# Patient Record
Sex: Female | Born: 1955 | Race: White | Hispanic: No | Marital: Single | Smoking: Never smoker
Health system: Southern US, Community
[De-identification: ages and names within clinical notes are randomized; demographics above are authoritative.]

## PROBLEM LIST (undated history)

## (undated) DIAGNOSIS — I10 Essential (primary) hypertension: Secondary | ICD-10-CM

## (undated) DIAGNOSIS — F32A Depression, unspecified: Secondary | ICD-10-CM

## (undated) DIAGNOSIS — M1990 Unspecified osteoarthritis, unspecified site: Secondary | ICD-10-CM

## (undated) DIAGNOSIS — F419 Anxiety disorder, unspecified: Secondary | ICD-10-CM

---

## 2011-07-12 DIAGNOSIS — I7300 Raynaud's syndrome without gangrene: Secondary | ICD-10-CM | POA: Insufficient documentation

## 2011-11-29 DIAGNOSIS — M797 Fibromyalgia: Secondary | ICD-10-CM | POA: Insufficient documentation

## 2011-11-29 DIAGNOSIS — R5383 Other fatigue: Secondary | ICD-10-CM | POA: Insufficient documentation

## 2012-03-15 DIAGNOSIS — M179 Osteoarthritis of knee, unspecified: Secondary | ICD-10-CM | POA: Insufficient documentation

## 2014-03-27 DIAGNOSIS — M5030 Other cervical disc degeneration, unspecified cervical region: Secondary | ICD-10-CM | POA: Insufficient documentation

## 2017-08-31 DIAGNOSIS — F5101 Primary insomnia: Secondary | ICD-10-CM | POA: Insufficient documentation

## 2019-08-02 DIAGNOSIS — M25561 Pain in right knee: Secondary | ICD-10-CM | POA: Insufficient documentation

## 2020-04-18 DIAGNOSIS — E039 Hypothyroidism, unspecified: Secondary | ICD-10-CM | POA: Insufficient documentation

## 2020-04-23 ENCOUNTER — Other Ambulatory Visit: Payer: Self-pay | Admitting: Family Medicine

## 2020-04-23 DIAGNOSIS — Z1231 Encounter for screening mammogram for malignant neoplasm of breast: Secondary | ICD-10-CM

## 2021-03-17 ENCOUNTER — Other Ambulatory Visit: Payer: Self-pay

## 2021-03-17 ENCOUNTER — Emergency Department (HOSPITAL_COMMUNITY)
Admission: EM | Admit: 2021-03-17 | Discharge: 2021-03-17 | Disposition: A | Discharge status: Home / Self Care | Payer: Self-pay | Attending: Emergency Medicine | Admitting: Emergency Medicine

## 2021-03-17 DIAGNOSIS — Z23 Encounter for immunization: Secondary | ICD-10-CM | POA: Insufficient documentation

## 2021-03-17 DIAGNOSIS — Y9239 Other specified sports and athletic area as the place of occurrence of the external cause: Secondary | ICD-10-CM | POA: Insufficient documentation

## 2021-03-17 DIAGNOSIS — W228XXA Striking against or struck by other objects, initial encounter: Secondary | ICD-10-CM | POA: Insufficient documentation

## 2021-03-17 DIAGNOSIS — S81811A Laceration without foreign body, right lower leg, initial encounter: Secondary | ICD-10-CM | POA: Insufficient documentation

## 2021-03-17 MED ORDER — LIDOCAINE-EPINEPHRINE 2 %-1:100000 IJ SOLN
30.0000 mL | Freq: Once | INTRAMUSCULAR | Status: AC
  Administered 2021-03-17: 30 mL
  Filled 2021-03-17: qty 2

## 2021-03-17 MED ORDER — TETANUS-DIPHTH-ACELL PERTUSSIS 5-2.5-18.5 LF-MCG/0.5 IM SUSY
0.5000 mL | PREFILLED_SYRINGE | Freq: Once | INTRAMUSCULAR | Status: AC
  Administered 2021-03-17: 0.5 mL via INTRAMUSCULAR
  Filled 2021-03-17 (×2): qty 0.5

## 2021-03-17 MED ORDER — OXYCODONE-ACETAMINOPHEN 5-325 MG PO TABS
1.0000 | ORAL_TABLET | Freq: Once | ORAL | Status: AC
  Administered 2021-03-17: 1 via ORAL
  Filled 2021-03-17: qty 1

## 2021-03-17 MED ORDER — OXYCODONE-ACETAMINOPHEN 5-325 MG PO TABS: 1.0000 | ORAL_TABLET | Freq: Four times a day (QID) | ORAL | 0 refills | Status: DC | PRN

## 2021-03-17 MED ORDER — AMOXICILLIN-POT CLAVULANATE 875-125 MG PO TABS: 1.0000 | ORAL_TABLET | Freq: Two times a day (BID) | ORAL | 0 refills | Status: DC
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
## 2021-03-17 NOTE — Discharge Instructions (Addendum)
Please change the dressing every other day.  The skin will become black and blue and will start pulling apart.  The new skin will grow from underneath.  If there is significant redness or drainage or swelling then you can start taking Augmentin as prescribed.  Otherwise you do not need to take antibiotics  Take tylenol for pain and percocet for severe pain   You will need suture removal in a week   Return to ER if you have worse leg pain or swelling or redness or fevers

## 2021-03-25 ENCOUNTER — Emergency Department (HOSPITAL_COMMUNITY)
Admission: EM | Admit: 2021-03-25 | Discharge: 2021-03-26 | Disposition: A | Discharge status: Home / Self Care | Payer: Self-pay | Attending: Emergency Medicine | Admitting: Emergency Medicine

## 2021-03-25 ENCOUNTER — Other Ambulatory Visit: Payer: Self-pay

## 2021-03-25 ENCOUNTER — Encounter (HOSPITAL_COMMUNITY): Payer: Self-pay

## 2021-03-25 DIAGNOSIS — L089 Local infection of the skin and subcutaneous tissue, unspecified: Secondary | ICD-10-CM | POA: Insufficient documentation

## 2021-03-25 LAB — CBC WITH DIFFERENTIAL/PLATELET
Abs Immature Granulocytes: 0.02 10*3/uL (ref 0.00–0.07)
Basophils Absolute: 0.1 10*3/uL (ref 0.0–0.1)
Basophils Relative: 1 %
Eosinophils Absolute: 0.1 10*3/uL (ref 0.0–0.5)
Eosinophils Relative: 1 %
HCT: 41.1 % (ref 36.0–46.0)
Hemoglobin: 13.4 g/dL (ref 12.0–15.0)
Immature Granulocytes: 0 %
Lymphocytes Relative: 37 %
Lymphs Abs: 2.6 10*3/uL (ref 0.7–4.0)
MCH: 28.9 pg (ref 26.0–34.0)
MCHC: 32.6 g/dL (ref 30.0–36.0)
MCV: 88.6 fL (ref 80.0–100.0)
Monocytes Absolute: 0.4 10*3/uL (ref 0.1–1.0)
Monocytes Relative: 6 %
Neutro Abs: 3.8 10*3/uL (ref 1.7–7.7)
Neutrophils Relative %: 55 %
Platelets: 315 10*3/uL (ref 150–400)
RBC: 4.64 MIL/uL (ref 3.87–5.11)
RDW: 12.8 % (ref 11.5–15.5)
WBC: 7 10*3/uL (ref 4.0–10.5)
nRBC: 0 % (ref 0.0–0.2)

## 2021-03-25 LAB — BASIC METABOLIC PANEL
Anion gap: 6 (ref 5–15)
BUN: 14 mg/dL (ref 8–23)
CO2: 29 mmol/L (ref 22–32)
Calcium: 9.1 mg/dL (ref 8.9–10.3)
Chloride: 102 mmol/L (ref 98–111)
Creatinine, Ser: 0.71 mg/dL (ref 0.44–1.00)
GFR, Estimated: >60 mL/min (ref >60)
Glucose, Bld: 106 mg/dL — ABNORMAL HIGH (ref 70–99)
Potassium: 3.5 mmol/L (ref 3.5–5.1)
Sodium: 137 mmol/L (ref 135–145)

## 2021-03-25 MED ORDER — VANCOMYCIN HCL 1250 MG/250ML IV SOLN
1250.0000 mg | Freq: Once | INTRAVENOUS | Status: AC
  Administered 2021-03-25: 1250 mg via INTRAVENOUS
  Filled 2021-03-25: qty 250

## 2021-03-25 MED ORDER — SULFAMETHOXAZOLE-TRIMETHOPRIM 800-160 MG PO TABS: 1.0000 | ORAL_TABLET | Freq: Two times a day (BID) | ORAL | 0 refills | Status: AC

## 2021-03-25 MED ORDER — DIPHENHYDRAMINE HCL 25 MG PO CAPS
25.0000 mg | ORAL_CAPSULE | Freq: Once | ORAL | Status: AC
  Administered 2021-03-26: 25 mg via ORAL
  Filled 2021-03-25: qty 1

## 2021-03-25 MED ORDER — HYDROMORPHONE HCL 1 MG/ML IJ SOLN
0.5000 mg | INTRAMUSCULAR | Status: AC
  Administered 2021-03-25 – 2021-03-26 (×2): 0.5 mg via INTRAVENOUS
  Filled 2021-03-25 (×2): qty 1

## 2021-03-25 MED ORDER — SODIUM CHLORIDE 0.9 % IV BOLUS
1000.0000 mL | Freq: Once | INTRAVENOUS | Status: AC
  Administered 2021-03-25: 1000 mL via INTRAVENOUS

## 2021-03-25 MED ORDER — HYDROMORPHONE HCL 1 MG/ML IJ SOLN
0.5000 mg | Freq: Once | INTRAMUSCULAR | Status: AC
  Administered 2021-03-25: 0.5 mg via INTRAVENOUS
  Filled 2021-03-25: qty 1

## 2021-03-25 NOTE — Discharge Instructions (Signed)
As discussed, get plenty of rest, take your medication as prescribed and be sure to follow-up with your physician in 2 or 3 days for suture removal.  Return here for concerning changes in your condition.
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
## 2021-03-25 NOTE — Progress Notes (Signed)
A consult was received from an ED physician for Dalbavancin per pharmacy dosing.  The patient's profile has been reviewed for ht/wt/allergies/indication/available labs.   Patient has no SIRS risk criteria.  Discussed with Dr Jeraldine Loots- will switch to Vancomycin loading dose x1 then Bactrim at discharge.  A one time order has been placed for Vancomycin 1250mg  IV.  Further antibiotics/pharmacy consults should be ordered by admitting physician if indicated.                       Thank you,  PharmD 03/25/2021  10:37 PM

## 2021-03-25 NOTE — ED Triage Notes (Signed)
Pt reports right lower leg infection. Pt seen last week and received many stitches. Pt reports redness, swelling, and drainage around incision. Sent from [facility] for IV abx.

## 2021-03-25 NOTE — ED Provider Notes (Signed)
Emergency Medicine Provider Triage Evaluation Note  [name] , a 65 y.o. female  was evaluated in triage.  Pt complains of wound to rle.  Review of Systems  Positive: Wound to the rle Negative: fevers  Physical Exam  BP (!) 152/84 (BP Location: Left Arm)   Pulse 67   Temp 98 F (36.7 C) (Oral)   Resp 17   Wt 56.7 kg   SpO2 96%   BMI 21.46 kg/m  Gen:   Awake, no distress   Resp:  Normal effort  MSK:   Moves extremities without difficulty  Other:  Large laceration to the right lower leg, there is some warmth and erythema inferior to the wound. No crepitus or purulent drainage noted.   Medical Decision Making  Medically screening exam initiated at 8:44 PM.  Appropriate orders placed.  Shadawn Hanaway was informed that the remainder of the evaluation will be completed by another provider, this initial triage assessment does not replace that evaluation, and the importance of remaining in the ED until their evaluation is complete.    Rayne Du 03/25/21 2047    Gerhard Munch, MD 03/26/21 2233

## 2021-03-26 MED ORDER — DIPHENHYDRAMINE HCL 25 MG PO CAPS
25.0000 mg | ORAL_CAPSULE | Freq: Once | ORAL | Status: AC
  Administered 2021-03-26: 25 mg via ORAL
  Filled 2021-03-26: qty 1

## 2021-03-26 NOTE — ED Notes (Signed)
Pt requested to stop the vancomycin. 28.47ml showed as remaining on the pump. Pt does not want to continue to pruritus reaction to antibiotic.

## 2021-03-26 NOTE — ED Notes (Signed)
Pt c/o itching Vanco stopped and order for benadryl requested

## 2021-04-05 ENCOUNTER — Emergency Department (HOSPITAL_BASED_OUTPATIENT_CLINIC_OR_DEPARTMENT_OTHER): Payer: Self-pay

## 2021-04-05 ENCOUNTER — Emergency Department (HOSPITAL_BASED_OUTPATIENT_CLINIC_OR_DEPARTMENT_OTHER)
Admission: EM | Admit: 2021-04-05 | Discharge: 2021-04-05 | Disposition: A | Discharge status: Home / Self Care | Payer: Self-pay | Attending: Emergency Medicine | Admitting: Emergency Medicine

## 2021-04-05 ENCOUNTER — Other Ambulatory Visit: Payer: Self-pay

## 2021-04-05 ENCOUNTER — Encounter (HOSPITAL_BASED_OUTPATIENT_CLINIC_OR_DEPARTMENT_OTHER): Payer: Self-pay

## 2021-04-05 DIAGNOSIS — I10 Essential (primary) hypertension: Secondary | ICD-10-CM | POA: Insufficient documentation

## 2021-04-05 DIAGNOSIS — L03115 Cellulitis of right lower limb: Secondary | ICD-10-CM | POA: Insufficient documentation

## 2021-04-05 DIAGNOSIS — L089 Local infection of the skin and subcutaneous tissue, unspecified: Secondary | ICD-10-CM | POA: Insufficient documentation

## 2021-04-05 HISTORY — DX: Depression, unspecified: F32.A

## 2021-04-05 HISTORY — DX: Essential (primary) hypertension: I10

## 2021-04-05 HISTORY — DX: Anxiety disorder, unspecified: F41.9

## 2021-04-05 HISTORY — DX: Unspecified osteoarthritis, unspecified site: M19.90

## 2021-04-05 LAB — CBC WITH DIFFERENTIAL/PLATELET
Abs Immature Granulocytes: 0.01 10*3/uL (ref 0.00–0.07)
Basophils Absolute: 0.1 10*3/uL (ref 0.0–0.1)
Basophils Relative: 1 %
Eosinophils Absolute: 0.1 10*3/uL (ref 0.0–0.5)
Eosinophils Relative: 1 %
HCT: 40.3 % (ref 36.0–46.0)
Hemoglobin: 13.3 g/dL (ref 12.0–15.0)
Immature Granulocytes: 0 %
Lymphocytes Relative: 37 %
Lymphs Abs: 2.9 10*3/uL (ref 0.7–4.0)
MCH: 28.2 pg (ref 26.0–34.0)
MCHC: 33 g/dL (ref 30.0–36.0)
MCV: 85.4 fL (ref 80.0–100.0)
Monocytes Absolute: 0.3 10*3/uL (ref 0.1–1.0)
Monocytes Relative: 4 %
Neutro Abs: 4.5 10*3/uL (ref 1.7–7.7)
Neutrophils Relative %: 57 %
Platelets: 328 10*3/uL (ref 150–400)
RBC: 4.72 MIL/uL (ref 3.87–5.11)
RDW: 12.9 % (ref 11.5–15.5)
WBC: 7.8 10*3/uL (ref 4.0–10.5)
nRBC: 0 % (ref 0.0–0.2)

## 2021-04-05 LAB — COMPREHENSIVE METABOLIC PANEL
ALT: 25 U/L (ref 0–44)
AST: 26 U/L (ref 15–41)
Albumin: 4.4 g/dL (ref 3.5–5.0)
Alkaline Phosphatase: 67 U/L (ref 38–126)
Anion gap: 9 (ref 5–15)
BUN: 12 mg/dL (ref 8–23)
CO2: 29 mmol/L (ref 22–32)
Calcium: 9.4 mg/dL (ref 8.9–10.3)
Chloride: 100 mmol/L (ref 98–111)
Creatinine, Ser: 0.78 mg/dL (ref 0.44–1.00)
GFR, Estimated: >60 mL/min (ref >60)
Glucose, Bld: 78 mg/dL (ref 70–99)
Potassium: 3.4 mmol/L — ABNORMAL LOW (ref 3.5–5.1)
Sodium: 138 mmol/L (ref 135–145)
Total Bilirubin: 0.3 mg/dL (ref 0.3–1.2)
Total Protein: 7.5 g/dL (ref 6.5–8.1)

## 2021-04-05 LAB — LACTIC ACID, PLASMA: Lactic Acid, Venous: 0.8 mmol/L (ref 0.5–1.9)

## 2021-04-05 MED ORDER — HYDROMORPHONE HCL 1 MG/ML IJ SOLN
1.0000 mg | Freq: Once | INTRAMUSCULAR | Status: AC
  Administered 2021-04-05: 1 mg via INTRAVENOUS
  Filled 2021-04-05: qty 1

## 2021-04-05 MED ORDER — PIPERACILLIN-TAZOBACTAM 3.375 G IVPB 30 MIN
3.3750 g | Freq: Once | INTRAVENOUS | Status: AC
  Administered 2021-04-05: 3.375 g via INTRAVENOUS
  Filled 2021-04-05: qty 50

## 2021-04-05 MED ORDER — DOXYCYCLINE HYCLATE 100 MG PO CAPS: 100.0000 mg | ORAL_CAPSULE | Freq: Two times a day (BID) | ORAL | 0 refills | Status: AC

## 2021-04-05 MED ORDER — OXYCODONE-ACETAMINOPHEN 5-325 MG PO TABS: 1.0000 | ORAL_TABLET | Freq: Four times a day (QID) | ORAL | 0 refills | Status: DC | PRN

## 2021-04-05 MED ORDER — VANCOMYCIN HCL IN DEXTROSE 1-5 GM/200ML-% IV SOLN
1000.0000 mg | Freq: Once | INTRAVENOUS | Status: AC
  Administered 2021-04-05: 1000 mg via INTRAVENOUS
  Filled 2021-04-05: qty 200

## 2021-04-05 MED ORDER — MORPHINE SULFATE (PF) 4 MG/ML IV SOLN
4.0000 mg | Freq: Once | INTRAVENOUS | Status: AC
  Administered 2021-04-05: 4 mg via INTRAVENOUS
  Filled 2021-04-05: qty 1

## 2021-04-05 MED ORDER — PIPERACILLIN-TAZOBACTAM 3.375 G IVPB: 3.3750 g | Freq: Three times a day (TID) | INTRAVENOUS | Status: DC

## 2021-04-05 MED ORDER — VANCOMYCIN HCL 500 MG/100ML IV SOLN
500.0000 mg | Freq: Two times a day (BID) | INTRAVENOUS | Status: DC
  Filled 2021-04-05: qty 100

## 2021-04-05 NOTE — ED Provider Notes (Signed)
[facility] EMERGENCY DEPT Provider Note   CSN: [id number] Arrival date & time: 04/05/21  1542     History Chief Complaint  Patient presents with  . Wound Check    [name] is a 65 y.o. female.  HPI      65 year old female with a history of hypertension, depression, wound that was repaired on May 3, for which she went to [place] [facility] on the 11th with concern for wound infection, but who presents with concern for worsening pain.   5/3 injury, 5/11 went to [facility] for infection--been on antibiotics since 5/3-initiially amox, then bactrim 5/11 Monday had stitches out and looked better Wednesday began to look worse again, pain worsened last night with the bandage change, redness to area Is scheduled to see wound care at the beginning of June.  Denies any fevers .  Had nausea at 1 point due to severity of pain.  Reports that she has difficulty sleeping due to the severity of her pain.  Past Medical History:  Diagnosis Date  . Anxiety   . Arthritis   . Depression   . Hypertension     There are no problems to display for this patient.   No past surgical history on file.   OB History   No obstetric history on file.     No family history on file.  Social History   Tobacco Use  . Smoking status: Never Smoker  . Smokeless tobacco: Never Used  Vaping Use  . Vaping Use: Never used  Substance Use Topics  . Alcohol use: Never  . Drug use: Never    Home Medications Prior to Admission medications   Medication Sig Start Date End Date Taking? Authorizing Provider  doxycycline (VIBRAMYCIN) 100 MG capsule Take 1 capsule (100 mg total) by mouth 2 (two) times daily for 10 days. 04/05/21 04/15/21 Yes Alvira Monday, MD  oxyCODONE-acetaminophen (PERCOCET/ROXICET) 5-325 MG tablet Take 1 tablet by mouth every 6 (six) hours as needed for severe pain. 04/05/21  Yes Alvira Monday, MD    Allergies    Ciprofloxacin, Levofloxacin, and Cephalexin  Review of Systems    Review of Systems  Constitutional: Negative for fever.  HENT: Negative for sore throat.   Eyes: Negative for visual disturbance.  Respiratory: Negative for cough and shortness of breath.   Cardiovascular: Negative for chest pain.  Gastrointestinal: Negative for abdominal pain, nausea and vomiting.  Genitourinary: Negative for difficulty urinating.  Musculoskeletal: Positive for arthralgias. Negative for back pain and neck pain.  Skin: Positive for wound. Negative for rash.  Neurological: Negative for syncope and headaches.    Physical Exam Updated Vital Signs BP (!) 145/89   Pulse 69   Temp 98.3 F (36.8 C) (Oral)   Resp 16   Ht 5\' 4"  (1.626 m)   Wt 56.7 kg   SpO2 97%   BMI 21.46 kg/m   Physical Exam Vitals and nursing note reviewed.  Constitutional:      General: She is not in acute distress.    Appearance: She is well-developed. She is not diaphoretic.  HENT:     Head: Normocephalic and atraumatic.  Eyes:     Conjunctiva/sclera: Conjunctivae normal.  Cardiovascular:     Rate and Rhythm: Normal rate and regular rhythm.     Heart sounds: Normal heart sounds. No murmur heard. No friction rub. No gallop.   Pulmonary:     Effort: Pulmonary effort is normal. No respiratory distress.     Breath sounds: Normal breath  sounds. No wheezing or rales.  Abdominal:     General: There is no distension.     Palpations: Abdomen is soft.     Tenderness: There is no abdominal tenderness. There is no guarding.  Musculoskeletal:        General: Tenderness (RLE. No fluctuance) present.     Cervical back: Normal range of motion.  Skin:    General: Skin is warm and dry.     Findings: No erythema or rash.  Neurological:     Mental Status: She is alert and oriented to person, place, and time.             ED Results / Procedures / Treatments   Labs (all labs ordered are listed, but only abnormal results are displayed) Labs Reviewed  COMPREHENSIVE METABOLIC PANEL -  Abnormal; Notable for the following components:      Result Value   Potassium 3.4 (*)    All other components within normal limits  CULTURE, BLOOD (ROUTINE X 2)  CULTURE, BLOOD (ROUTINE X 2)  CBC WITH DIFFERENTIAL/PLATELET  LACTIC ACID, PLASMA    EKG None  Radiology DG Tibia/Fibula Right  Result Date: 04/05/2021 CLINICAL DATA:  Right leg pain EXAM: RIGHT TIBIA AND FIBULA - 2 VIEW COMPARISON:  None. FINDINGS: There is no evidence of fracture or other focal bone lesions. At least mild degenerative arthritis is seen involving the lateral compartment of the right knee. Mild diffuse subcutaneous edema within the right lower extremity peripherally. Soft tissues are otherwise unremarkable. IMPRESSION: No acute fracture or dislocation Electronically Signed   By: Helyn Numbers MD   On: 04/05/2021 20:54    Procedures Procedures   Medications Ordered in ED Medications  morphine 4 MG/ML injection 4 mg (4 mg Intravenous Given 04/05/21 2041)  vancomycin (VANCOCIN) IVPB 1000 mg/200 mL premix (0 mg Intravenous Stopped 04/05/21 2308)  piperacillin-tazobactam (ZOSYN) IVPB 3.375 g (0 g Intravenous Stopped 04/05/21 2125)  HYDROmorphone (DILAUDID) injection 1 mg (1 mg Intravenous Given 04/05/21 2151)    ED Course  I have reviewed the triage vital signs and the nursing notes.  Pertinent labs & imaging results that were available during my care of the patient were reviewed by me and considered in my medical decision making (see chart for details).    MDM Rules/Calculators/A&P                          65 year old female with a history of hypertension, depression, wound that was repaired on May 3, for which she went to [place] [facility] on the 11th with concern for wound infection, but who presents with concern for worsening pain.  She has been on 2 different antibiotics, is having worsening pain and redness.  She does not have any systemic symptoms.  Given failure of oral antibiotics, discussed possibility of  admission to the hospital, however given the area is localized and she is not having other systemic symptoms, feel it is still reasonable to switch antibiotics and have her follow-up closely with wound care as an outpatient.  Labs were obtained which showed no evidence of sepsis, including no sign no leukocytosis or lactic acidosis.  She was given IV antibiotics in the emergency department.  X-ray was done which showed no evidence of soft tissue gas, and have low suspicion for necrotizing infection.  Do feel is possible that her wound may require debridement. She declines admission at this time. Will switch to doxycycline, give pain medication after  reviewing the [facility] drug database. Patient discharged in stable condition with understanding of reasons to return.     Final Clinical Impression(s) / ED Diagnoses Final diagnoses:  Cellulitis of right lower extremity  Wound infection    Rx / DC Orders ED Discharge Orders         Ordered    doxycycline (VIBRAMYCIN) 100 MG capsule  2 times daily        04/05/21 2141    oxyCODONE-acetaminophen (PERCOCET/ROXICET) 5-325 MG tablet  Every 6 hours PRN        04/05/21 2141           Alvira Monday, MD 04/07/21 0100

## 2021-04-05 NOTE — ED Notes (Signed)
Pt verbalizes understanding of discharge instructions. Opportunity for questioning and answers were provided. Armand removed by staff, pt discharged from ED to home. Pt educated on wound care and to pick up Rx.

## 2021-04-05 NOTE — Progress Notes (Signed)
Pharmacy Antibiotic Note  [name] is a 65 y.o. female admitted on 04/05/2021 with sepsis.  Pharmacy has been consulted for vancomycin and zosyn dosing.  Plan: Vancomycin 1000 mg IV x 1, then 500 mg IV q 12h (eAUC 437, Goal AUC 400-550, SCr used 0.8) Zosyn 3.375g IV every 8 hours (extended infusion) Monitor renal function, Cx and clinical progression to narrow Vancomycin levels as needed  Height: 5\' 4"  (162.6 cm) Weight: 56.7 kg (125 lb) IBW/kg (Calculated) : 54.7  Temp (24hrs), Avg:98.3 F (36.8 C), Min:98.3 F (36.8 C), Max:98.3 F (36.8 C)  No results for input(s): WBC, CREATININE, LATICACIDVEN, VANCOTROUGH, VANCOPEAK, VANCORANDOM, GENTTROUGH, GENTPEAK, GENTRANDOM, TOBRATROUGH, TOBRAPEAK, TOBRARND, AMIKACINPEAK, AMIKACINTROU, AMIKACIN in the last 168 hours.  Estimated Creatinine Clearance: 62.2 mL/min (by C-G formula based on SCr of 0.71 mg/dL).    Allergies  Allergen Reactions  . Ciprofloxacin Anaphylaxis    Toxic    . Levofloxacin Anaphylaxis  . Cephalexin Other (See Comments)    Joint pain, neuropathy    , PharmD Clinical Pharmacist ED Pharmacist Phone # [phone] 04/05/2021 8:12 PM

## 2021-04-05 NOTE — ED Triage Notes (Signed)
Pt with non-healing wound to distal RLE since this first on May. Pt is followed by the wound clinic and has an appointment on 6/2. Pt states she is here today because she feels like it is getting worse.

## 2021-04-11 LAB — CULTURE, BLOOD (ROUTINE X 2)
Culture: NO GROWTH
Culture: NO GROWTH
Specimen Description: ADEQUATE

## 2021-04-16 ENCOUNTER — Encounter (HOSPITAL_BASED_OUTPATIENT_CLINIC_OR_DEPARTMENT_OTHER): Payer: Self-pay | Attending: Internal Medicine | Admitting: Internal Medicine

## 2021-04-16 ENCOUNTER — Other Ambulatory Visit: Payer: Self-pay

## 2021-04-16 DIAGNOSIS — W500XXS Accidental hit or strike by another person, sequela: Secondary | ICD-10-CM | POA: Insufficient documentation

## 2021-04-16 DIAGNOSIS — L97818 Non-pressure chronic ulcer of other part of right lower leg with other specified severity: Secondary | ICD-10-CM | POA: Insufficient documentation

## 2021-04-16 DIAGNOSIS — S80811S Abrasion, right lower leg, sequela: Secondary | ICD-10-CM | POA: Insufficient documentation
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
## 2021-04-16 NOTE — Progress Notes (Signed)
[name] ([id number]) Visit Report for 04/16/2021 Abuse/Suicide Risk Screen Details Patient Name: Date of Service: [name], [name] 04/16/2021 2:45 PM Medical Record Number: [id number] Patient Account Number: [id number] Date of Birth/Sex: Treating RN: 02-02-1956 (65 y.o. Female) Antonieta Iba Primary Care Tacie Mccuistion: O'DO Consuello Closs Other Clinician: Referring Parisha Beaulac: Treating Mehar Kirkwood/Extender: Baltazar Najjar O'DO [name], [name] Weeks in Treatment: 0 Abuse/Suicide Risk Screen Items Answer ABUSE RISK SCREEN: Has anyone close to you tried to hurt or harm you recentlyo No Do you feel uncomfortable with anyone in your familyo No Has anyone forced you do things that you didnt want to doo No Electronic Signature(s) Signed: 04/16/2021 5:54:05 PM By: Antonieta Iba Entered By: Antonieta Iba on 04/16/2021 15:05:26 -------------------------------------------------------------------------------- Activities of Daily Living Details Patient Name: Date of Service: [name] 04/16/2021 2:45 PM Medical Record Number: [id number] Patient Account Number: [id number] Date of Birth/Sex: Treating RN: [birth year] (65 y.o. Female) Antonieta Iba Primary Care Zacaria Pousson: O'DO Consuello Closs Other Clinician: Referring Sanaia Jasso: Treating Keeshia Sanderlin/Extender: Baltazar Najjar O'DO [name], [name] Weeks in Treatment: 0 Activities of Daily Living Items Answer Activities of Daily Living (Please select one for each item) Drive Automobile Completely Able T Medications ake Completely Able Use T elephone Completely Able Care for Appearance Completely Able Use T oilet Completely Able Bath / Shower Completely Able Dress Self Completely Able Feed Self Completely Able Walk Completely Able Get In / Out Bed Completely Able Housework Completely Able Prepare Meals Completely Able Handle Money Completely Able Shop for Self Completely Able Electronic Signature(s) Signed: 04/16/2021 5:54:05 PM By: Antonieta Iba Entered By: Antonieta Iba on 04/16/2021 15:05:47 -------------------------------------------------------------------------------- Education Screening Details Patient Name: Date of Service: [name] 04/16/2021 2:45 PM Medical Record Number: [id number] Patient Account Number: [id number] Date of Birth/Sex: Treating RN: 11/13/1956 (65 y.o. Female) Antonieta Iba Primary Care Dallyn Bergland: O'DO Consuello Closs Other Clinician: Referring Mckale Haffey: Treating Yacine Droz/Extender: Baltazar Najjar O'DO [name], [name] Weeks in Treatment: 0 Primary Learner Assessed: Patient Learning Preferences/Education Level/Primary Language Learning Preference: Explanation, Demonstration, Printed Material Highest Education Level: College or Above Preferred Language: English Cognitive Barrier Language Barrier: No Translator Needed: No Memory Deficit: No Emotional Barrier: No Cultural/Religious Beliefs Affecting Medical Care: No Physical Barrier Impaired Vision: Yes Glasses Impaired Hearing: No Decreased Hand dexterity: No Knowledge/Comprehension Knowledge Level: High Comprehension Level: High Ability to understand written instructions: High Ability to understand verbal instructions: High Motivation Anxiety Level: Calm Cooperation: Cooperative Education Importance: Acknowledges Need Interest in Health Problems: Asks Questions Perception: Coherent Willingness to Engage in Self-Management High Activities: Readiness to Engage in Self-Management High Activities: Electronic Signature(s) Signed: 04/16/2021 5:54:05 PM By: Antonieta Iba Entered By: Antonieta Iba on 04/16/2021 15:06:20 -------------------------------------------------------------------------------- Fall Risk Assessment Details Patient Name: Date of Service: [name] 04/16/2021 2:45 PM Medical Record Number: [id number] Patient Account Number: [id number] Date of Birth/Sex: Treating RN: [birth year] (65 y.o. Female) Antonieta Iba Primary Care Nayelis Bonito: O'DO Consuello Closs Other Clinician: Referring Carlicia Leavens: Treating Kelsey Edman/Extender: Baltazar Najjar O'DO [name], [name] Weeks in Treatment: 0 Fall Risk Assessment Items Have you had 2 or more falls in the last 12 monthso 0 No Have you had any fall that resulted in injury in the last 12 monthso 0 No FALLS RISK SCREEN History of falling - immediate or within 3 months 25 Yes Secondary diagnosis (Do you have 2 or more medical diagnoseso) 0 No Ambulatory aid None/bed rest/wheelchair/nurse 0 Yes Crutches/cane/walker 0 No Furniture 0 No Intravenous therapy Access/Saline/Heparin Lock 0 No Gait/Transferring Normal/ bed rest/  wheelchair 0 Yes Weak (short steps with or without shuffle, stooped but able to lift head while walking, may seek 0 No support from furniture) Impaired (short steps with shuffle, may have difficulty arising from chair, head down, impaired 0 No balance) Mental Status Oriented to own ability 0 Yes Electronic Signature(s) Signed: 04/16/2021 5:54:05 PM By: Antonieta Iba Entered By: Antonieta Iba on 04/16/2021 15:06:49 -------------------------------------------------------------------------------- Foot Assessment Details Patient Name: Date of Service: [name] 04/16/2021 2:45 PM Medical Record Number: [id number] Patient Account Number: [id number] Date of Birth/Sex: Treating RN: 1956-09-28 (65 y.o. Female) Antonieta Iba Primary Care Vyncent Overby: O'DO Consuello Closs Other Clinician: Referring Ranvir Renovato: Treating Malala Trenkamp/Extender: Baltazar Najjar O'DO [name], [name] Weeks in Treatment: 0 Foot Assessment Items Site Locations + = Sensation present, - = Sensation absent, C = Callus, U = Ulcer R = Redness, W = Warmth, M = Maceration, PU = Pre-ulcerative lesion F = Fissure, S = Swelling, D = Dryness Assessment Right: Left: Other Deformity: No No Prior Foot Ulcer: No No Prior Amputation: No No Charcot Joint: No No Ambulatory  Status: Ambulatory Without Help Gait: Steady Electronic Signature(s) Signed: 04/16/2021 5:54:05 PM By: Antonieta Iba Entered By: Antonieta Iba on 04/16/2021 15:13:10 -------------------------------------------------------------------------------- Nutrition Risk Screening Details Patient Name: Date of Service: JNIYAH, DANTUONO 04/16/2021 2:45 PM Medical Record Number: [id number] Patient Account Number: [id number] Date of Birth/Sex: Treating RN: Jul 13, 1956 (65 y.o. Female) Antonieta Iba Primary Care Linkon Siverson: O'DO Consuello Closs Other Clinician: Referring Calynn Ferrero: Treating Bessye Stith/Extender: Baltazar Najjar O'DO [name], [name] Weeks in Treatment: 0 Height (in): 64 Weight (lbs): 125 Body Mass Index (BMI): 21.5 Nutrition Risk Screening Items Score Screening NUTRITION RISK SCREEN: I have an illness or condition that made me change the kind and/or amount of food I eat 0 No I eat fewer than two meals per day 0 No I eat few fruits and vegetables, or milk products 0 No I have three or more drinks of beer, liquor or wine almost every day 0 No I have tooth or mouth problems that make it hard for me to eat 0 No I don't always have enough money to buy the food I need 0 No I eat alone most of the time 0 No I take three or more different prescribed or over-the-counter drugs a day 0 No Without wanting to, I have lost or gained 10 pounds in the last six months 0 No I am not always physically able to shop, cook and/or feed myself 0 No Nutrition Protocols Good Risk Protocol 0 No interventions needed Moderate Risk Protocol High Risk Proctocol Risk Level: Good Risk Score: 0 Electronic Signature(s) Signed: 04/16/2021 5:54:05 PM By: Antonieta Iba Entered By: Antonieta Iba on 04/16/2021 15:07:00

## 2021-04-22 NOTE — Progress Notes (Signed)
ANSHIKA, PETHTEL ([id number]) Visit Report for 04/16/2021 Allergy List Details Patient Name: Date of Service: [name], [name] 04/16/2021 2:45 PM Medical Record Number: [id number] Patient Account Number: [id number] Date of Birth/Sex: Treating RN: 1956/01/29 (65 y.o. Female) Antonieta Iba Primary Care Kelwin Gibler: O'DO Consuello Closs Other Clinician: Referring Fitzhugh Vizcarrondo: Treating Oluwatosin Bracy/Extender: Baltazar Najjar O'DO [name], [name] Weeks in Treatment: 0 Allergies Active Allergies ciprofloxacin levofloxacin cephalexin Allergy Notes Electronic Signature(s) Signed: 04/16/2021 5:54:05 PM By: Antonieta Iba Entered By: Antonieta Iba on 04/16/2021 15:01:32 -------------------------------------------------------------------------------- Arrival Information Details Patient Name: Date of Service: [name] 04/16/2021 2:45 PM Medical Record Number: [id number] Patient Account Number: [id number] Date of Birth/Sex: Treating RN: 1956/08/09 (65 y.o. Female) Antonieta Iba Primary Care Myquan Schaumburg: O'DO Consuello Closs Other Clinician: Referring Sayvon Arterberry: Treating Camyla Camposano/Extender: Baltazar Najjar O'DO [name], [name] Weeks in Treatment: 0 Visit Information Patient Arrived: Ambulatory Arrival Time: 14:51 Accompanied By: Brother Transfer Assistance: None Patient Identification Verified: Yes Secondary Verification Process Completed: Yes Patient Requires Transmission-Based Precautions: No Patient Has Alerts: No Electronic Signature(s) Signed: 04/16/2021 5:54:05 PM By: Antonieta Iba Entered By: Antonieta Iba on 04/16/2021 14:54:45 -------------------------------------------------------------------------------- Clinic Level of Care Assessment Details Patient Name: Date of Service: CAROLANNE, MERCIER 04/16/2021 2:45 PM Medical Record Number: [id number] Patient Account Number: [id number] Date of Birth/Sex: Treating RN: [birth year] (65 y.o. Female) Shawn Stall Primary Care Hurman Ketelsen: O'DO Consuello Closs Other  Clinician: Referring Javeria Briski: Treating Riely Oetken/Extender: Baltazar Najjar O'DO [name], [name] Weeks in Treatment: 0 Clinic Level of Care Assessment Items TOOL 2 Quantity Score X- 1 0 Use when only an EandM is performed on the INITIAL visit ASSESSMENTS - Nursing Assessment / Reassessment X- 1 20 General Physical Exam (combine w/ comprehensive assessment (listed just below) when performed on new pt. evals) X- 1 25 Comprehensive Assessment (HX, ROS, Risk Assessments, Wounds Hx, etc.) ASSESSMENTS - Wound and Skin A ssessment / Reassessment X - Simple Wound Assessment / Reassessment - one wound 1 5 []  - 0 Complex Wound Assessment / Reassessment - multiple wounds X- 1 10 Dermatologic / Skin Assessment (not related to wound area) ASSESSMENTS - Ostomy and/or Continence Assessment and Care []  - 0 Incontinence Assessment and Management []  - 0 Ostomy Care Assessment and Management (repouching, etc.) PROCESS - Coordination of Care X - Simple Patient / Family Education for ongoing care 1 15 []  - 0 Complex (extensive) Patient / Family Education for ongoing care X- 1 10 Staff obtains , Records, T Results / Process Orders est []  - 0 Staff telephones HHA, Nursing Homes / Clarify orders / etc []  - 0 Routine Transfer to another Facility (non-emergent condition) []  - 0 Routine Hospital Admission (non-emergent condition) []  - 0 New Admissions / / Ordering NPWT Apligraf, etc. , []  - 0 Emergency Hospital Admission (emergent condition) X- 1 10 Simple Discharge Coordination []  - 0 Complex (extensive) Discharge Coordination PROCESS - Special Needs []  - 0 Pediatric / Minor Patient Management []  - 0 Isolation Patient Management []  - 0 Hearing / Language / Visual special needs []  - 0 Assessment of Community assistance (transportation, D/C planning, etc.) []  - 0 Additional assistance / Altered mentation []  - 0 Support Surface(s) Assessment (bed,  cushion, seat, etc.) INTERVENTIONS - Wound Cleansing / Measurement X- 1 5 Wound Imaging (photographs - any number of wounds) []  - 0 Wound Tracing (instead of photographs) X- 1 5 Simple Wound Measurement - one wound []  - 0 Complex Wound Measurement - multiple wounds X- 1 5 Simple Wound Cleansing - one  wound []  - 0 Complex Wound Cleansing - multiple wounds INTERVENTIONS - Wound Dressings X - Small Wound Dressing one or multiple wounds 1 10 []  - 0 Medium Wound Dressing one or multiple wounds []  - 0 Large Wound Dressing one or multiple wounds []  - 0 Application of Medications - injection INTERVENTIONS - Miscellaneous []  - 0 External ear exam []  - 0 Specimen Collection (cultures, biopsies, blood, body fluids, etc.) []  - 0 Specimen(s) / Culture(s) sent or taken to Lab for analysis []  - 0 Patient Transfer (multiple staff / Michiel SitesHoyer Lift / Similar devices) []  - 0 Simple Staple / Suture removal (25 or less) []  - 0 Complex Staple / Suture removal (26 or more) []  - 0 Hypo / Hyperglycemic Management (close monitor of Blood Glucose) []  - 0 Ankle / Brachial Index (ABI) - do not check if billed separately Has the patient been seen at the hospital within the last three years: Yes Total Score: 120 Level Of Care: New/Established - Level 4 Electronic Signature(s) Signed: 04/16/2021 5:16:10 PM By: Shawn Stalleaton, Bobbi Entered By: Shawn Stalleaton, Bobbi on 04/16/2021 15:47:02 -------------------------------------------------------------------------------- Encounter Discharge Information Details Patient Name: Date of Service: [name], LA URA 04/16/2021 2:45 PM Medical Record Number: [id number] Patient Account Number: [id number] Date of Birth/Sex: Treating RN: 03/23/1956 (65 y.o. Female) Zandra AbtsLynch, Shatara Primary Care Harel Repetto: O'DO Consuello ClossNNELL, [name] Other Clinician: Referring Sianne Tejada: Treating Kareena Arrambide/Extender: Baltazar Najjarobson, Michael O'DO [name], [name] Weeks in Treatment: 0 Encounter Discharge Information  Items Discharge Condition: Stable Ambulatory Status: Ambulatory Discharge Destination: Home Transportation: Private Auto Schedule Follow-up Appointment: Yes Clinical Summary of Care: Patient Declined Electronic Signature(s) Signed: 04/22/2021 5:57:05 PM By: Zandra AbtsLynch, Shatara RN, BSN Entered By: Zandra AbtsLynch, Shatara on 04/16/2021 16:10:14 -------------------------------------------------------------------------------- Lower Extremity Assessment Details Patient Name: Date of Service: [name], LA URA 04/16/2021 2:45 PM Medical Record Number: [id number] Patient Account Number: [id number] Date of Birth/Sex: Treating RN: 03/25/1956 (65 y.o. Female) Antonieta IbaBarnhart, Jodi Primary Care Asad Keeven: Other Clinician: O'DO Consuello ClossNNELL, [name] Referring Aamilah Augenstein: Treating Roshan Roback/Extender: Baltazar Najjarobson, Michael O'DO [name], [name] Weeks in Treatment: 0 Edema Assessment Assessed: [Left: No] [Right: Yes] Edema: [Left: Ye] [Right: s] Calf Left: Right: Point of Measurement: 31 cm From Medial Instep 31 cm Ankle Left: Right: Point of Measurement: 8 cm From Medial Instep 19 cm Knee To Floor Left: Right: From Medial Instep 36 cm Vascular Assessment Pulses: Dorsalis Pedis Palpable: [Right:Yes] Notes Unable to tolerate ABI d/t pain Electronic Signature(s) Signed: 04/16/2021 5:54:05 PM By: Antonieta IbaBarnhart, Jodi Entered By: Antonieta IbaBarnhart, Jodi on 04/16/2021 15:15:30 -------------------------------------------------------------------------------- Multi Wound Chart Details Patient Name: Date of Service: [name], LA URA 04/16/2021 2:45 PM Medical Record Number: [id number] Patient Account Number: [id number] Date of Birth/Sex: Treating RN: 02/23/1956 (65 y.o. Female) Shawn Stalleaton, Bobbi Primary Care Tavonna Worthington: O'DO Consuello ClossNNELL, [name] Other Clinician: Referring Kineta Fudala: Treating Hezikiah Retzloff/Extender: Baltazar Najjarobson, Michael O'DO [name], [name] Weeks in Treatment: 0 Vital Signs Height(in): 64 Pulse(bpm): 67 Weight(lbs): 125 Blood Pressure(mmHg):  152/90 Body Mass Index(BMI): 21 Temperature(F): 98.6 Respiratory Rate(breaths/min): 18 Photos: [1:No Photos Right, Anterior Lower Leg] [N/A:N/A N/A] Wound Location: [1:Trauma] [N/A:N/A] Wounding Event: [1:Dehisced Wound] [N/A:N/A] Primary Etiology: [1:Hypertension, Osteoarthritis] [N/A:N/A] Comorbid History: [1:04/16/2021] [N/A:N/A] Date Acquired: [1:0] [N/A:N/A] Weeks of Treatment: [1:Open] [N/A:N/A] Wound Status: [1:6x5x0.1] [N/A:N/A] Measurements L x W x D (cm) [1:23.562] [N/A:N/A] A (cm) : rea [1:2.356] [N/A:N/A] Volume (cm) : [1:Full Thickness Without Exposed] [N/A:N/A] Classification: [1:Support Structures Medium] [N/A:N/A] Exudate Amount: [1:Serosanguineous] [N/A:N/A] Exudate Type: [1:red, brown] [N/A:N/A] Exudate Color: [1:Distinct, outline attached] [N/A:N/A] Wound Margin: [1:Large (67-100%)] [N/A:N/A] Granulation Amount: [1:Red] [N/A:N/A] Granulation Quality: [  1:None Present (0%)] [N/A:N/A] Necrotic Amount: [1:Fat Layer (Subcutaneous Tissue): Yes N/A] Exposed Structures: [1:Fascia: No Tendon: No Muscle: No Joint: No Bone: No None] [N/A:N/A] Treatment Notes Electronic Signature(s) Signed: 04/16/2021 4:50:31 PM By: Baltazar Najjar MD Signed: 04/16/2021 5:16:10 PM By: Shawn Stall Entered By: Baltazar Najjar on 04/16/2021 16:05:39 -------------------------------------------------------------------------------- Multi-Disciplinary Care Plan Details Patient Name: Date of Service: NONIE, LOCHNER 04/16/2021 2:45 PM Medical Record Number: [id number] Patient Account Number: [id number] Date of Birth/Sex: Treating RN: [birth year] (65 y.o. Female) Shawn Stall Primary Care Yilin Weedon: O'DO Consuello Closs Other Clinician: Referring Lizmarie Witters: Treating Kaedyn Belardo/Extender: Baltazar Najjar O'DO [name], [name] Weeks in Treatment: 0 Active Inactive Orientation to the Wound Care Program Nursing Diagnoses: Knowledge deficit related to the wound healing center  program Goals: Patient/caregiver will verbalize understanding of the Wound Healing Center Program Date Initiated: 04/16/2021 Target Resolution Date: 05/22/2021 Goal Status: Active Interventions: Provide education on orientation to the wound center Notes: Pain, Acute or Chronic Nursing Diagnoses: Pain, acute or chronic: actual or potential Potential alteration in comfort, pain Goals: Patient will verbalize adequate pain control and receive pain control interventions during procedures as needed Date Initiated: 04/16/2021 Target Resolution Date: 05/21/2021 Goal Status: Active Patient/caregiver will verbalize adequate pain control between visits Date Initiated: 04/16/2021 Target Resolution Date: 05/21/2021 Goal Status: Active Interventions: Encourage patient to take pain medications as prescribed Provide education on pain management Reposition patient for comfort Treatment Activities: Administer pain control measures as ordered : 04/16/2021 Notes: Wound/Skin Impairment Nursing Diagnoses: Knowledge deficit related to ulceration/compromised skin integrity Goals: Patient/caregiver will verbalize understanding of skin care regimen Date Initiated: 04/16/2021 Target Resolution Date: 05/22/2021 Goal Status: Active Interventions: Assess patient/caregiver ability to obtain necessary supplies Assess patient/caregiver ability to perform ulcer/skin care regimen upon admission and as needed Provide education on ulcer and skin care Treatment Activities: Skin care regimen initiated : 04/16/2021 Topical wound management initiated : 04/16/2021 Notes: Electronic Signature(s) Signed: 04/16/2021 5:16:10 PM By: Shawn Stall Entered By: Shawn Stall on 04/16/2021 15:36:04 -------------------------------------------------------------------------------- Pain Assessment Details Patient Name: Date of Service: DORCAS, MELITO 04/16/2021 2:45 PM Medical Record Number: [id number] Patient Account Number: [id number] Date  of Birth/Sex: Treating RN: 03-12-1956 (65 y.o. Female) Antonieta Iba Primary Care Iyonna Rish: O'DO Consuello Closs Other Clinician: Referring Pharrell Ledford: Treating Derec Mozingo/Extender: Baltazar Najjar O'DO [name], [name] Weeks in Treatment: 0 Active Problems Location of Pain Severity and Description of Pain Patient Has Paino Yes Site Locations Pain Location: Pain in Ulcers With Dressing Change: Yes Duration of the Pain. Constant / Intermittento Intermittent Rate the pain. Current Pain Level: 7 Character of Pain Describe the Pain: Aching, Burning Pain Management and Medication Current Pain Management: Medication: Yes Cold Application: No Rest: Yes Massage: No Activity: No T.E.N.S.: No Heat Application: No Leg drop or elevation: No Is the Current Pain Management Adequate: Inadequate How does your wound impact your activities of daily livingo Sleep: Yes Bathing: No Appetite: No Relationship With Others: No Bladder Continence: No Emotions: No Bowel Continence: No Work: No Toileting: No Drive: No Dressing: No Hobbies: No Electronic Signature(s) Signed: 04/16/2021 5:54:05 PM By: Antonieta Iba Entered By: Antonieta Iba on 04/16/2021 15:08:05 -------------------------------------------------------------------------------- Patient/Caregiver Education Details Patient Name: Date of Service: [name] 6/2/2022andnbsp2:45 PM Medical Record Number: [id number] Patient Account Number: [id number] Date of Birth/Gender: Treating RN: 1956-06-06 (65 y.o. Female) Shawn Stall Primary Care Physician: O'DO Consuello Closs Other Clinician: Referring Physician: Treating Physician/Extender: Baltazar Najjar O'DO [name], Pricilla Larsson in Treatment: 0 Education Assessment Education Provided To: Patient Education Topics  Provided Welcome T [facility]: o Handouts: Welcome T [facility] o Methods: Explain/Verbal, Printed Responses: Reinforcements needed Wound  Debridement: Handouts: Wound Debridement Methods: Explain/Verbal, Printed Responses: Reinforcements needed Electronic Signature(s) Signed: 04/16/2021 5:16:10 PM By: Shawn Stall Entered By: Shawn Stall on 04/16/2021 15:36:24 -------------------------------------------------------------------------------- Wound Assessment Details Patient Name: Date of Service: KELIS, PLASSE 04/16/2021 2:45 PM Medical Record Number: [id number] Patient Account Number: [id number] Date of Birth/Sex: Treating RN: [birth year] (65 y.o. Female) Antonieta Iba Primary Care Jamason Peckham: O'DO Consuello Closs Other Clinician: Referring Bayne Fosnaugh: Treating Gaia Gullikson/Extender: Baltazar Najjar O'DO [name], [name] Weeks in Treatment: 0 Wound Status Wound Number: 1 Primary Etiology: Dehisced Wound Wound Location: Right, Anterior Lower Leg Wound Status: Open Wounding Event: Trauma Comorbid History: Hypertension, Osteoarthritis Date Acquired: 04/16/2021 Weeks Of Treatment: 0 Clustered Wound: No Photos Wound Measurements Length: (cm) 6 Width: (cm) 5 Depth: (cm) 0.1 Area: (cm) 23.562 Volume: (cm) 2.356 % Reduction in Area: 0% % Reduction in Volume: 0% Epithelialization: None Tunneling: No Undermining: No Wound Description Classification: Full Thickness Without Exposed Support Structures Wound Margin: Distinct, outline attached Exudate Amount: Medium Exudate Type: Serosanguineous Exudate Color: red, brown Foul Odor After Cleansing: No Slough/Fibrino No Wound Bed Granulation Amount: Large (67-100%) Exposed Structure Granulation Quality: Red Fascia Exposed: No Necrotic Amount: None Present (0%) Fat Layer (Subcutaneous Tissue) Exposed: Yes Tendon Exposed: No Muscle Exposed: No Joint Exposed: No Bone Exposed: No Treatment Notes Wound #1 (Lower Leg) Wound Laterality: Right, Anterior Cleanser Soap and Water Discharge Instruction: May shower and wash wound with dial antibacterial soap and water prior to  dressing change. Peri-Wound Care Topical Primary Dressing Hydrofera Blue Ready Foam, 4x5 in Discharge Instruction: Apply to wound bed as instructed Secondary Dressing Zetuvit Plus Silicone Border Dressing 4x4 (in/in) Discharge Instruction: ***patient to go to [facility] for bordered foam.***Apply silicone border over primary dressing as directed. Secured With Compression Wrap Compression Stockings Facilities manager) Signed: 04/16/2021 5:54:05 PM By: Antonieta Iba Signed: 04/17/2021 5:48:41 PM By: Karl Ito Entered By: Karl Ito on 04/16/2021 16:56:16 -------------------------------------------------------------------------------- Vitals Details Patient Name: Date of Service: [name] 04/16/2021 2:45 PM Medical Record Number: [id number] Patient Account Number: [id number] Date of Birth/Sex: Treating RN: 08-10-1956 (65 y.o. Female) Antonieta Iba Primary Care Soleia Badolato: O'DO Consuello Closs Other Clinician: Referring Dazha Kempa: Treating Trinidy Masterson/Extender: Baltazar Najjar O'DO [name], [name] Weeks in Treatment: 0 Vital Signs Time Taken: 14:54 Temperature (F): 98.6 Height (in): 64 Pulse (bpm): 67 Source: Stated Respiratory Rate (breaths/min): 18 Weight (lbs): 125 Blood Pressure (mmHg): 152/90 Source: Stated Reference Range: 80 - 120 mg / dl Body Mass Index (BMI): 21.5 Electronic Signature(s) Signed: 04/16/2021 5:54:05 PM By: Antonieta Iba Entered By: Antonieta Iba on 04/16/2021 15:01:06

## 2021-04-23 ENCOUNTER — Other Ambulatory Visit: Payer: Self-pay

## 2021-04-23 ENCOUNTER — Encounter (HOSPITAL_BASED_OUTPATIENT_CLINIC_OR_DEPARTMENT_OTHER): Payer: Self-pay | Admitting: Internal Medicine

## 2021-04-23 NOTE — Progress Notes (Addendum)
[name], Gill ([id number]) Visit Report for 04/23/2021 Arrival Information Details Patient Name: Date of Service: [name], [name] 04/23/2021 2:45 PM Medical Record Number: [id number] Patient Account Number: [id number] Date of Birth/Sex: Treating RN: [birth year] (65 y.o. Roel Cluck Primary Care Forbes Loll: O'DO Consuello Closs Other Clinician: Referring Vasiliki Smaldone: Treating Lyrick Worland/Extender: Baltazar Najjar O'DO [name], [name] Weeks in Treatment: 1 Visit Information History Since Last Visit Added or deleted any medications: No Patient Arrived: Ambulatory Any new allergies or adverse reactions: No Arrival Time: 14:46 Had a fall or experienced change in No Accompanied By: Aunt activities of daily living that may affect Transfer Assistance: None risk of falls: Patient Identification Verified: Yes Signs or symptoms of abuse/neglect since last visito No Secondary Verification Process Completed: Yes Hospitalized since last visit: No Patient Requires Transmission-Based Precautions: No Implantable device outside of the clinic excluding No Patient Has Alerts: No cellular tissue based products placed in the center since last visit: Has Dressing in Place as Prescribed: Yes Pain Present Now: Yes Electronic Signature(s) Signed: 04/23/2021 5:42:55 PM By: Antonieta Iba Entered By: Antonieta Iba on 04/23/2021 14:51:08 -------------------------------------------------------------------------------- Encounter Discharge Information Details Patient Name: Date of Service: [name] 04/23/2021 2:45 PM Medical Record Number: [id number] Patient Account Number: [id number] Date of Birth/Sex: Treating RN: 30-Jan-1956 (65 y.o. [name] Primary Care Maida Widger: O'DO Consuello Closs Other Clinician: Referring Damarion Mendizabal: Treating Karyme Mcconathy/Extender: Baltazar Najjar O'DO [name], [name] Weeks in Treatment: 1 Encounter Discharge Information Items Post Procedure Vitals Discharge Condition:  Stable Temperature (F): 98.2 Ambulatory Status: Ambulatory Pulse (bpm): 88 Discharge Destination: Home Respiratory Rate (breaths/min): 18 Transportation: Private Auto Blood Pressure (mmHg): 149/104 Accompanied By: aunt Schedule Follow-up Appointment: Yes Clinical Summary of Care: Patient Declined Electronic Signature(s) Signed: 04/23/2021 5:15:25 PM By: Zenaida Deed RN, BSN Entered By: Zenaida Deed on 04/23/2021 15:28:56 -------------------------------------------------------------------------------- Lower Extremity Assessment Details Patient Name: Date of Service: [name], [name] 04/23/2021 2:45 PM Medical Record Number: [id number] Patient Account Number: [id number] Date of Birth/Sex: Treating RN: [birth year] (65 y.o. Roel Cluck Primary Care Elisa Sorlie: O'DO Consuello Closs Other Clinician: Referring Camillo Quadros: Treating Jayanna Kroeger/Extender: Baltazar Najjar O'DO [name], [name] Weeks in Treatment: 1 Edema Assessment Assessed: [Left: No] [Right: Yes] Edema: [Left: Ye] [Right: s] Calf Left: Right: Point of Measurement: 31 cm From Medial Instep 30.5 cm Ankle Left: Right: Point of Measurement: 8 cm From Medial Instep 19 cm Vascular Assessment Pulses: Dorsalis Pedis Palpable: [Right:Yes] Electronic Signature(s) Signed: 04/23/2021 5:42:55 PM By: Antonieta Iba Entered By: Antonieta Iba on 04/23/2021 14:53:45 -------------------------------------------------------------------------------- Multi Wound Chart Details Patient Name: Date of Service: [name] 04/23/2021 2:45 PM Medical Record Number: [id number] Patient Account Number: [id number] Date of Birth/Sex: Treating RN: Dec 19, 1955 (65 y.o. [name], Millard.Loa Primary Care Amad Mau: O'DO Consuello Closs Other Clinician: Referring Chrissi Crow: Treating Blasa Raisch/Extender: Baltazar Najjar O'DO [name], [name] Weeks in Treatment: 1 Vital Signs Height(in): 64 Pulse(bpm): 88 Weight(lbs): 125 Blood Pressure(mmHg):  149/104 Body Mass Index(BMI): 21 Temperature(F): 98.2 Respiratory Rate(breaths/min): 18 Photos: [1:No Photos Right, Anterior Lower Leg] [N/A:N/A N/A] Wound Location: [1:Trauma] [N/A:N/A] Wounding Event: [1:Dehisced Wound] [N/A:N/A] Primary Etiology: [1:Hypertension, Osteoarthritis] [N/A:N/A] Comorbid History: [1:04/16/2021] [N/A:N/A] Date Acquired: [1:1] [N/A:N/A] Weeks of Treatment: [1:Open] [N/A:N/A] Wound Status: [1:2.5x2.8x0.1] [N/A:N/A] Measurements L x W x D (cm) [1:5.498] [N/A:N/A] A (cm) : rea [1:0.55] [N/A:N/A] Volume (cm) : [1:76.70%] [N/A:N/A] % Reduction in Area: [1:76.70%] [N/A:N/A] % Reduction in Volume: [1:Full Thickness Without Exposed] [N/A:N/A] Classification: [1:Support Structures Medium] [N/A:N/A] Exudate A mount: [1:Serosanguineous] [N/A:N/A] Exudate Type: [1:red, brown] [  N/A:N/A] Exudate Color: [1:Distinct, outline attached] [N/A:N/A] Wound Margin: [1:Large (67-100%)] [N/A:N/A] Granulation A mount: [1:Red] [N/A:N/A] Granulation Quality: [1:Small (1-33%)] [N/A:N/A] Necrotic A mount: [1:Fat Layer (Subcutaneous Tissue): Yes N/A] Exposed Structures: [1:Fascia: No Tendon: No Muscle: No Joint: No Bone: No Medium (34-66%)] [N/A:N/A] Epithelialization: [1:Debridement - Excisional] [N/A:N/A] Debridement: Pre-procedure Verification/Time Out 15:05 [N/A:N/A] Taken: [1:Other] [N/A:N/A] Pain Control: [1:Subcutaneous, Slough] [N/A:N/A] Tissue Debrided: [1:Skin/Subcutaneous Tissue] [N/A:N/A] Level: [1:7] [N/A:N/A] Debridement A (sq cm): [1:rea Curette] [N/A:N/A] Instrument: [1:Minimum] [N/A:N/A] Bleeding: [1:Pressure] [N/A:N/A] Hemostasis A chieved: [1:1] [N/A:N/A] Procedural Pain: [1:4] [N/A:N/A] Post Procedural Pain: [1:Procedure was tolerated well] [N/A:N/A] Debridement Treatment Response: [1:2.5x2.8x0.1] [N/A:N/A] Post Debridement Measurements L x W x D (cm) [1:0.55] [N/A:N/A] Post Debridement Volume: (cm) [1:Debridement] [N/A:N/A] Treatment  Notes Electronic Signature(s) Signed: 04/23/2021 4:38:36 PM By: Baltazar Najjar MD Signed: 04/23/2021 6:02:04 PM By: Shawn Stall Entered By: Baltazar Najjar on 04/23/2021 15:19:15 -------------------------------------------------------------------------------- Multi-Disciplinary Care Plan Details Patient Name: Date of Service: [name], [name] 04/23/2021 2:45 PM Medical Record Number: [id number] Patient Account Number: [id number] Date of Birth/Sex: Treating RN: 22-Apr-1956 (65 y.o. [name] Primary Care Zoria Rawlinson: O'DO Consuello Closs Other Clinician: Referring Zoi Devine: Treating Yamilka Lopiccolo/Extender: Baltazar Najjar O'DO [name], [name] Weeks in Treatment: 1 Active Inactive Pain, Acute or Chronic Nursing Diagnoses: Pain, acute or chronic: actual or potential Potential alteration in comfort, pain Goals: Patient will verbalize adequate pain control and receive pain control interventions during procedures as needed Date Initiated: 04/16/2021 Target Resolution Date: 05/21/2021 Goal Status: Active Patient/caregiver will verbalize adequate pain control between visits Date Initiated: 04/16/2021 Target Resolution Date: 05/21/2021 Goal Status: Active Interventions: Encourage patient to take pain medications as prescribed Provide education on pain management Reposition patient for comfort Treatment Activities: Administer pain control measures as ordered : 04/16/2021 Notes: Wound/Skin Impairment Nursing Diagnoses: Knowledge deficit related to ulceration/compromised skin integrity Goals: Patient/caregiver will verbalize understanding of skin care regimen Date Initiated: 04/16/2021 Target Resolution Date: 05/22/2021 Goal Status: Active Interventions: Assess patient/caregiver ability to obtain necessary supplies Assess patient/caregiver ability to perform ulcer/skin care regimen upon admission and as needed Provide education on ulcer and skin care Treatment Activities: Skin care regimen  initiated : 04/16/2021 Topical wound management initiated : 04/16/2021 Notes: Electronic Signature(s) Signed: 04/23/2021 6:02:04 PM By: Shawn Stall Entered By: Shawn Stall on 04/23/2021 15:06:34 -------------------------------------------------------------------------------- Pain Assessment Details Patient Name: Date of Service: [name], [name] 04/23/2021 2:45 PM Medical Record Number: [id number] Patient Account Number: [id number] Date of Birth/Sex: Treating RN: [birth year] (65 y.o. Roel Cluck Primary Care Rosellen Lichtenberger: O'DO Consuello Closs Other Clinician: Referring Mao Lockner: Treating Fransisca Shawn/Extender: Baltazar Najjar O'DO [name], [name] Weeks in Treatment: 1 Active Problems Location of Pain Severity and Description of Pain Patient Has Paino Yes Site Locations Pain Location: Pain in Ulcers With Dressing Change: Yes Duration of the Pain. Constant / Intermittento Intermittent Rate the pain. Current Pain Level: 6 Character of Pain Describe the Pain: Burning, Throbbing Pain Management and Medication Current Pain Management: Medication: Yes Cold Application: No Rest: Yes Massage: No Activity: No T.E.N.S.: No Heat Application: No Leg drop or elevation: No Is the Current Pain Management Adequate: Inadequate How does your wound impact your activities of daily livingo Sleep: Yes Bathing: No Appetite: No Relationship With Others: No Bladder Continence: No Emotions: No Bowel Continence: No Work: No Toileting: No Drive: No Dressing: No Hobbies: No Electronic Signature(s) Signed: 04/23/2021 5:42:55 PM By: Antonieta Iba Entered By: Antonieta Iba on 04/23/2021 14:51:49 -------------------------------------------------------------------------------- Patient/Caregiver Education Details Patient Name: Date of Service: [name] 6/9/2022andnbsp2:45  PM Medical Record Number: [id number] Patient Account Number: [id number] Date of Birth/Gender: Treating RN: 1956/01/02 (65  y.o. [name] Primary Care Physician: O'DO Consuello Closs Other Clinician: Referring Physician: Treating Physician/Extender: Baltazar Najjar O'DO [name], Pricilla Larsson in Treatment: 1 Education Assessment Education Provided To: Patient Education Topics Provided Pain: Handouts: A Guide to Pain Control Methods: Explain/Verbal Responses: Reinforcements needed Electronic Signature(s) Signed: 04/23/2021 6:02:04 PM By: Shawn Stall Entered By: Shawn Stall on 04/23/2021 15:06:46 -------------------------------------------------------------------------------- Wound Assessment Details Patient Name: Date of Service: [name], [name] 04/23/2021 2:45 PM Medical Record Number: [id number] Patient Account Number: [id number] Date of Birth/Sex: Treating RN: 1956/05/22 (65 y.o. Roel Cluck Primary Care Renell Coaxum: O'DO Consuello Closs Other Clinician: Referring Kamori Kitchens: Treating Jelan Batterton/Extender: Baltazar Najjar O'DO [name], [name] Weeks in Treatment: 1 Wound Status Wound Number: 1 Primary Etiology: Dehisced Wound Wound Location: Right, Anterior Lower Leg Wound Status: Open Wounding Event: Trauma Comorbid History: Hypertension, Osteoarthritis Date Acquired: 04/16/2021 Weeks Of Treatment: 1 Clustered Wound: No Photos Wound Measurements Length: (cm) 2.5 Width: (cm) 2.8 Depth: (cm) 0.1 Area: (cm) 5.498 Volume: (cm) 0.55 % Reduction in Area: 76.7% % Reduction in Volume: 76.7% Epithelialization: Medium (34-66%) Tunneling: No Undermining: No Wound Description Classification: Full Thickness Without Exposed Support Structures Wound Margin: Distinct, outline attached Exudate Amount: Medium Exudate Type: Serosanguineous Exudate Color: red, brown Foul Odor After Cleansing: No Slough/Fibrino Yes Wound Bed Granulation Amount: Large (67-100%) Exposed Structure Granulation Quality: Red Fascia Exposed: No Necrotic Amount: Small (1-33%) Fat Layer (Subcutaneous Tissue)  Exposed: Yes Necrotic Quality: Adherent Slough Tendon Exposed: No Muscle Exposed: No Joint Exposed: No Bone Exposed: No Treatment Notes Wound #1 (Lower Leg) Wound Laterality: Right, Anterior Cleanser Soap and Water Discharge Instruction: May shower and wash wound with dial antibacterial soap and water prior to dressing change. Peri-Wound Care Topical Primary Dressing PolyMem Silver Non-Adhesive Dressing, 4.25x4.25 in Discharge Instruction: Apply to wound bed as instructed Secondary Dressing Zetuvit Plus Silicone Border Dressing 4x4 (in/in) Discharge Instruction: ***patient to go to [facility] for bordered foam.***Apply silicone border over primary dressing as directed. Secured With Compression Wrap Compression Stockings Add-Ons Electronic Signature(s) Signed: 04/24/2021 3:50:24 PM By: Karl Ito Signed: 04/24/2021 5:10:19 PM By: Antonieta Iba Previous Signature: 04/23/2021 5:42:55 PM Version By: Antonieta Iba Entered By: Karl Ito on 04/24/2021 15:49:31 -------------------------------------------------------------------------------- Vitals Details Patient Name: Date of Service: [name], [name] 04/23/2021 2:45 PM Medical Record Number: [id number] Patient Account Number: [id number] Date of Birth/Sex: Treating RN: 03-16-1956 (65 y.o. Roel Cluck Primary Care Panagiotis Oelkers: O'DO Consuello Closs Other Clinician: Referring Joshoa Shawler: Treating Abeer Deskins/Extender: Baltazar Najjar O'DO [name], [name] Weeks in Treatment: 1 Vital Signs Time Taken: 14:50 Temperature (F): 98.2 Height (in): 64 Pulse (bpm): 88 Weight (lbs): 125 Respiratory Rate (breaths/min): 18 Body Mass Index (BMI): 21.5 Blood Pressure (mmHg): 149/104 Reference Range: 80 - 120 mg / dl Notes Patient states she took BP late today Electronic Signature(s) Signed: 04/23/2021 5:42:55 PM By: Antonieta Iba Entered By: Antonieta Iba on 04/23/2021 14:52:36
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
## 2021-04-27 ENCOUNTER — Other Ambulatory Visit: Payer: Self-pay

## 2021-04-27 ENCOUNTER — Encounter (HOSPITAL_BASED_OUTPATIENT_CLINIC_OR_DEPARTMENT_OTHER): Payer: Self-pay | Admitting: Internal Medicine
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
## 2021-04-29 NOTE — Progress Notes (Signed)
[name], [name] ([id number]) Visit Report for 04/27/2021 Arrival Information Details Patient Name: Date of Service: [name], [name] 04/27/2021 3:30 PM Medical Record Number: [id number] Patient Account Number: [id number] Date of Birth/Sex: Treating RN: Apr 05, 1956 (65 y.o. [name], [name] Primary Care Allenmichael Mcpartlin: O'DO Consuello Closs Other Clinician: Referring Markella Dao: Treating Jailen Coward/Extender: Baltazar Najjar O'DO [name], [name] Weeks in Treatment: 1 Visit Information History Since Last Visit Added or deleted any medications: No Patient Arrived: Ambulatory Any new allergies or adverse reactions: No Arrival Time: 15:55 Had a fall or experienced change in No Accompanied By: brother activities of daily living that may affect Transfer Assistance: None risk of falls: Patient Identification Verified: Yes Signs or symptoms of abuse/neglect since last visito No Secondary Verification Process Completed: Yes Hospitalized since last visit: No Patient Requires Transmission-Based Precautions: No Implantable device outside of the clinic excluding No Patient Has Alerts: No cellular tissue based products placed in the center since last visit: Has Dressing in Place as Prescribed: Yes Pain Present Now: Yes Electronic Signature(s) Signed: 04/29/2021 6:22:58 PM By: Fonnie Mu RN Entered By: Fonnie Mu on 04/27/2021 15:56:12 -------------------------------------------------------------------------------- Clinic Level of Care Assessment Details Patient Name: Date of Service: CHAYIL, GANTT 04/27/2021 3:30 PM Medical Record Number: [id number] Patient Account Number: [id number] Date of Birth/Sex: Treating RN: 04/04/1956 (65 y.o. [name] Primary Care Shallyn Constancio: O'DO Consuello Closs Other Clinician: Referring Soo Steelman: Treating Lamoine Magallon/Extender: Baltazar Najjar O'DO [name], [name] Weeks in Treatment: 1 Clinic Level of Care Assessment Items TOOL 4 Quantity Score []  - 0 Use  when only an EandM is performed on FOLLOW-UP visit ASSESSMENTS - Nursing Assessment / Reassessment X- 1 10 Reassessment of Co-morbidities (includes updates in patient status) X- 1 5 Reassessment of Adherence to Treatment Plan ASSESSMENTS - Wound and Skin A ssessment / Reassessment X - Simple Wound Assessment / Reassessment - one wound 1 5 []  - 0 Complex Wound Assessment / Reassessment - multiple wounds []  - 0 Dermatologic / Skin Assessment (not related to wound area) ASSESSMENTS - Focused Assessment []  - 0 Circumferential Edema Measurements - multi extremities []  - 0 Nutritional Assessment / Counseling / Intervention X- 1 5 Lower Extremity Assessment (monofilament, tuning fork, pulses) []  - 0 Peripheral Arterial Disease Assessment (using hand held doppler) ASSESSMENTS - Ostomy and/or Continence Assessment and Care []  - 0 Incontinence Assessment and Management []  - 0 Ostomy Care Assessment and Management (repouching, etc.) PROCESS - Coordination of Care X - Simple Patient / Family Education for ongoing care 1 15 []  - 0 Complex (extensive) Patient / Family Education for ongoing care X- 1 10 Staff obtains , Records, T Results / Process Orders est []  - 0 Staff telephones HHA, Nursing Homes / Clarify orders / etc []  - 0 Routine Transfer to another Facility (non-emergent condition) []  - 0 Routine Hospital Admission (non-emergent condition) []  - 0 New Admissions / / Ordering NPWT Apligraf, etc. , []  - 0 Emergency Hospital Admission (emergent condition) X- 1 10 Simple Discharge Coordination []  - 0 Complex (extensive) Discharge Coordination PROCESS - Special Needs []  - 0 Pediatric / Minor Patient Management []  - 0 Isolation Patient Management []  - 0 Hearing / Language / Visual special needs []  - 0 Assessment of Community assistance (transportation, D/C planning, etc.) []  - 0 Additional assistance / Altered mentation []  - 0 Support  Surface(s) Assessment (bed, cushion, seat, etc.) INTERVENTIONS - Wound Cleansing / Measurement X - Simple Wound Cleansing - one wound 1 5 []  - 0 Complex Wound  Cleansing - multiple wounds X- 1 5 Wound Imaging (photographs - any number of wounds) []  - 0 Wound Tracing (instead of photographs) X- 1 5 Simple Wound Measurement - one wound []  - 0 Complex Wound Measurement - multiple wounds INTERVENTIONS - Wound Dressings X - Small Wound Dressing one or multiple wounds 1 10 []  - 0 Medium Wound Dressing one or multiple wounds []  - 0 Large Wound Dressing one or multiple wounds X- 1 5 Application of Medications - topical []  - 0 Application of Medications - injection INTERVENTIONS - Miscellaneous []  - 0 External ear exam []  - 0 Specimen Collection (cultures, biopsies, blood, body fluids, etc.) []  - 0 Specimen(s) / Culture(s) sent or taken to Lab for analysis []  - 0 Patient Transfer (multiple staff / / Similar devices) []  - 0 Simple Staple / Suture removal (25 or less) []  - 0 Complex Staple / Suture removal (26 or more) []  - 0 Hypo / Hyperglycemic Management (close monitor of Blood Glucose) []  - 0 Ankle / Brachial Index (ABI) - do not check if billed separately X- 1 5 Vital Signs Has the patient been seen at the hospital within the last three years: Yes Total Score: 95 Level Of Care: New/Established - Level 3 Electronic Signature(s) Signed: 04/28/2021 6:10:01 PM By: RN, BSN Entered By: on 04/27/2021 16:29:23 -------------------------------------------------------------------------------- Encounter Discharge Information Details Patient Name: Date of Service: 04/27/2021 3:30 PM Medical Record Number: Patient Account Number: Date of Birth/Sex: Treating RN: 05/13/1956 (65 y.o. Primary Care Marialuiza Car: O'DO Other Clinician: Referring Quinteria Chisum: Treating  Nyomie Ehrlich/Extender: O'DO [name], [name] Weeks in Treatment: 1 Encounter Discharge Information Items Discharge Condition: Stable Ambulatory Status: Ambulatory Discharge Destination: Home Transportation: Private Auto Accompanied By: brother Schedule Follow-up Appointment: Yes Clinical Summary of Care: Patient Declined Electronic Signature(s) Signed: 04/29/2021 6:22:58 PM By: Zenaida Deed RN Entered By: Zenaida Deed on 04/27/2021 16:46:58 -------------------------------------------------------------------------------- Lower Extremity Assessment Details Patient Name: Date of Service: [name], [name] 04/27/2021 3:30 PM Medical Record Number: [id number] Patient Account Number: [id number] Date of Birth/Sex: Treating RN: 1956-06-19 (65 y.o. Toniann Fail Primary Care Raghad Lorenz: O'DO Consuello Closs Other Clinician: Referring Icey Tello: Treating Emmelia Holdsworth/Extender: Baltazar Najjar O'DO [name], [name] Weeks in Treatment: 1 Edema Assessment Assessed: [Left: No] [Right: Yes] Edema: [Left: Ye] [Right: s] Calf Left: Right: Point of Measurement: 31 cm From Medial Instep 30.5 cm Ankle Left: Right: Point of Measurement: 8 cm From Medial Instep 19 cm Vascular Assessment Pulses: Dorsalis Pedis Palpable: [Right:Yes] Posterior Tibial Palpable: [Right:Yes] Electronic Signature(s) Signed: 04/29/2021 6:22:58 PM By: Fonnie Mu RN Entered By: Fonnie Mu on 04/27/2021 15:58:44 -------------------------------------------------------------------------------- Multi Wound Chart Details Patient Name: Date of Service: [name] 04/27/2021 3:30 PM Medical Record Number: [id number] Patient Account Number: [id number] Date of Birth/Sex: Treating RN: [birth year] (65 y.o. Toniann Fail Primary Care Tiarna Koppen: O'DO Consuello Closs Other Clinician: Referring Shada Nienaber: Treating Teliyah Royal/Extender: Baltazar Najjar O'DO [name], [name] Weeks in Treatment:  1 Vital Signs Height(in): 64 Pulse(bpm): 84 Weight(lbs): 125 Blood Pressure(mmHg): 153/84 Body Mass Index(BMI): 21 Temperature(F): 98.4 Respiratory Rate(breaths/min): 17 Photos: [1:No Photos Right, Anterior Lower Leg] [N/A:N/A N/A] Wound Location: [1:Trauma] [N/A:N/A] Wounding Event: [1:Dehisced Wound] [N/A:N/A] Primary Etiology: [1:Hypertension, Osteoarthritis] [N/A:N/A] Comorbid History: [1:04/16/2021] [N/A:N/A] Date Acquired: [1:1] [N/A:N/A] Weeks of Treatment: [1:Open] [N/A:N/A] Wound Status: [1:2.5x1.9x0.1] [N/A:N/A] Measurements L x W x D (cm) [1:3.731] [N/A:N/A] A (cm) : rea [1:0.373] [N/A:N/A] Volume (cm) : [  1:84.20%] [N/A:N/A] % Reduction in A rea: [1:84.20%] [N/A:N/A] % Reduction in Volume: [1:Full Thickness Without Exposed] [N/A:N/A] Classification: [1:Support Structures Medium] [N/A:N/A] Exudate Amount: [1:Serosanguineous] [N/A:N/A] Exudate Type: [1:red, brown] [N/A:N/A] Exudate Color: [1:Distinct, outline attached] [N/A:N/A] Wound Margin: [1:Medium (34-66%)] [N/A:N/A] Granulation Amount: [1:Red] [N/A:N/A] Granulation Quality: [1:Medium (34-66%)] [N/A:N/A] Necrotic Amount: [1:Fat Layer (Subcutaneous Tissue): Yes N/A] Exposed Structures: [1:Fascia: No Tendon: No Muscle: No Joint: No Bone: No Medium (34-66%)] [N/A:N/A] Treatment Notes Electronic Signature(s) Signed: 04/27/2021 5:24:16 PM By: Baltazar Najjarobson, Michael MD Signed: 04/28/2021 6:10:01 PM By: Zenaida DeedBoehlein, Linda RN, BSN Entered By: Baltazar Najjarobson, Michael on 04/27/2021 16:42:54 -------------------------------------------------------------------------------- Multi-Disciplinary Care Plan Details Patient Name: Date of Service: [name] EwingLUSTY, [name] 04/27/2021 3:30 PM Medical Record Number: [id number] Patient Account Number: [id number] Date of Birth/Sex: Treating RN: 06/24/1956 (65 y.o. Tommye StandardF) Boehlein, Linda Primary Care Bryley Kovacevic: O'DO Consuello ClossNNELL, [name] Other Clinician: Referring Reeve Mallo: Treating Ithzel Fedorchak/Extender: Baltazar Najjarobson,  Michael O'DO [name], [name] Weeks in Treatment: 1 Active Inactive Pain, Acute or Chronic Nursing Diagnoses: Pain, acute or chronic: actual or potential Potential alteration in comfort, pain Goals: Patient will verbalize adequate pain control and receive pain control interventions during procedures as needed Date Initiated: 04/16/2021 Target Resolution Date: 05/21/2021 Goal Status: Active Patient/caregiver will verbalize adequate pain control between visits Date Initiated: 04/16/2021 Target Resolution Date: 05/21/2021 Goal Status: Active Interventions: Encourage patient to take pain medications as prescribed Provide education on pain management Reposition patient for comfort Treatment Activities: Administer pain control measures as ordered : 04/16/2021 Notes: Wound/Skin Impairment Nursing Diagnoses: Knowledge deficit related to ulceration/compromised skin integrity Goals: Patient/caregiver will verbalize understanding of skin care regimen Date Initiated: 04/16/2021 Target Resolution Date: 05/22/2021 Goal Status: Active Interventions: Assess patient/caregiver ability to obtain necessary supplies Assess patient/caregiver ability to perform ulcer/skin care regimen upon admission and as needed Provide education on ulcer and skin care Treatment Activities: Skin care regimen initiated : 04/16/2021 Topical wound management initiated : 04/16/2021 Notes: Electronic Signature(s) Signed: 04/28/2021 6:10:01 PM By: Zenaida DeedBoehlein, Linda RN, BSN Entered By: Zenaida DeedBoehlein, Linda on 04/27/2021 16:16:37 -------------------------------------------------------------------------------- Pain Assessment Details Patient Name: Date of Service: [name] EwingLUSTY, [name] 04/27/2021 3:30 PM Medical Record Number: [id number] Patient Account Number: [id number] Date of Birth/Sex: Treating RN: 10/08/1956 (65 y.o. Toniann FailF) Breedlove, [name] Primary Care Keenan Trefry: O'DO Consuello ClossNNELL, [name] Other Clinician: Referring Tasnim Balentine: Treating  Ludivina Guymon/Extender: Baltazar Najjarobson, Michael O'DO [name], [name] Weeks in Treatment: 1 Active Problems Location of Pain Severity and Description of Pain Patient Has Paino Yes Site Locations Pain Location: Pain in Ulcers With Dressing Change: Yes Duration of the Pain. Constant / Intermittento Intermittent Rate the pain. Current Pain Level: 7 Worst Pain Level: 10 Least Pain Level: 0 Tolerable Pain Level: 7 Character of Pain Describe the Pain: Aching Pain Management and Medication Current Pain Management: Medication: Yes Cold Application: No Rest: Yes Massage: No Activity: No T.E.N.S.: No Heat Application: No Leg drop or elevation: No Is the Current Pain Management Adequate: Adequate How does your wound impact your activities of daily livingo Sleep: No Bathing: No Appetite: No Relationship With Others: No Bladder Continence: No Emotions: No Bowel Continence: No Work: No Toileting: No Drive: No Dressing: No Hobbies: No Electronic Signature(s) Signed: 04/29/2021 6:22:58 PM By: Fonnie MuBreedlove, [name] RN Entered By: Fonnie MuBreedlove, [name] on 04/27/2021 15:57:02 -------------------------------------------------------------------------------- Patient/Caregiver Education Details Patient Name: Date of Service: [name] EwingLUSTY, [name] 6/13/2022andnbsp3:30 PM Medical Record Number: [id number] Patient Account Number: [id number] Date of Birth/Gender: Treating RN: 05/26/1956 (65 y.o. Tommye StandardF) Boehlein, Linda Primary Care Physician: O'DO Consuello ClossNNELL, [name] Other Clinician: Referring Physician: Treating Physician/Extender: Leanord Hawkingobson,  Casimiro Needle O'DO [name], [name] Weeks in Treatment: 1 Education Assessment Education Provided To: Patient Education Topics Provided Wound/Skin Impairment: Methods: Explain/Verbal Responses: Reinforcements needed, State content correctly Electronic Signature(s) Signed: 04/28/2021 6:10:01 PM By: Zenaida Deed RN, BSN Entered By: Zenaida Deed on 04/27/2021  16:17:04 -------------------------------------------------------------------------------- Wound Assessment Details Patient Name: Date of Service: [name], [name] 04/27/2021 3:30 PM Medical Record Number: [id number] Patient Account Number: [id number] Date of Birth/Sex: Treating RN: 1955-11-17 (65 y.o. [name], [name] Primary Care Ralston Venus: O'DO Consuello Closs Other Clinician: Referring Michae Grimley: Treating Shakyia Bosso/Extender: Baltazar Najjar O'DO [name], [name] Weeks in Treatment: 1 Wound Status Wound Number: 1 Primary Etiology: Dehisced Wound Wound Location: Right, Anterior Lower Leg Wound Status: Open Wounding Event: Trauma Comorbid History: Hypertension, Osteoarthritis Date Acquired: 04/16/2021 Weeks Of Treatment: 1 Clustered Wound: No Photos Wound Measurements Length: (cm) 2.5 Width: (cm) 1.9 Depth: (cm) 0.1 Area: (cm) 3.731 Volume: (cm) 0.373 % Reduction in Area: 84.2% % Reduction in Volume: 84.2% Epithelialization: Medium (34-66%) Tunneling: No Undermining: No Wound Description Classification: Full Thickness Without Exposed Support Structures Wound Margin: Distinct, outline attached Exudate Amount: Medium Exudate Type: Serosanguineous Exudate Color: red, brown Foul Odor After Cleansing: No Slough/Fibrino Yes Wound Bed Granulation Amount: Medium (34-66%) Exposed Structure Granulation Quality: Red Fascia Exposed: No Necrotic Amount: Medium (34-66%) Fat Layer (Subcutaneous Tissue) Exposed: Yes Necrotic Quality: Adherent Slough Tendon Exposed: No Muscle Exposed: No Joint Exposed: No Bone Exposed: No Treatment Notes Wound #1 (Lower Leg) Wound Laterality: Right, Anterior Cleanser Soap and Water Discharge Instruction: May shower and wash wound with dial antibacterial soap and water prior to dressing change. Peri-Wound Care Topical Primary Dressing PolyMem Silver Non-Adhesive Dressing, 4.25x4.25 in Discharge Instruction: Apply to wound bed as  instructed Secondary Dressing Zetuvit Plus Silicone Border Dressing 4x4 (in/in) Discharge Instruction: ***patient to go to [facility] for bordered foam.***Apply silicone border over primary dressing as directed. Secured With Compression Wrap Compression Stockings Facilities manager) Signed: 04/29/2021 1:43:18 PM By: Karl Ito Signed: 04/29/2021 6:22:58 PM By: Fonnie Mu RN Entered By: Karl Ito on 04/28/2021 12:02:26 -------------------------------------------------------------------------------- Vitals Details Patient Name: Date of Service: [name], [name] 04/27/2021 3:30 PM Medical Record Number: [id number] Patient Account Number: [id number] Date of Birth/Sex: Treating RN: 10-04-1956 (65 y.o. [name], [name] Primary Care Jamieson Lisa: O'DO Consuello Closs Other Clinician: Referring Aziz Slape: Treating Aarsh Fristoe/Extender: Baltazar Najjar O'DO [name], [name] Weeks in Treatment: 1 Vital Signs Time Taken: 15:56 Temperature (F): 98.4 Height (in): 64 Pulse (bpm): 84 Weight (lbs): 125 Respiratory Rate (breaths/min): 17 Body Mass Index (BMI): 21.5 Blood Pressure (mmHg): 153/84 Reference Range: 80 - 120 mg / dl Electronic Signature(s) Signed: 04/29/2021 6:22:58 PM By: Fonnie Mu RN Entered By: Fonnie Mu on 04/27/2021 15:56:36

## 2021-05-05 ENCOUNTER — Other Ambulatory Visit: Payer: Self-pay

## 2021-05-05 ENCOUNTER — Encounter (HOSPITAL_BASED_OUTPATIENT_CLINIC_OR_DEPARTMENT_OTHER): Payer: Self-pay | Admitting: Internal Medicine

## 2021-05-05 DIAGNOSIS — L97818 Non-pressure chronic ulcer of other part of right lower leg with other specified severity: Secondary | ICD-10-CM
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
## 2021-05-07 NOTE — Progress Notes (Signed)
[name], [name] ([id number]) Visit Report for 05/05/2021 Arrival Information Details Patient Name: Date of Service: [name] 05/05/2021 2:45 PM Medical Record Number: [id number] Patient Account Number: [id number] Date of Birth/Sex: Treating RN: 07-18-1956 (65 y.o. Wynelle Link Primary Care Damarrion Mimbs: O'DO Consuello Closs Other Clinician: Referring Rosali Augello: Treating Nyeemah Jennette/Extender: Geralyn Corwin O'DO [name], [name] Weeks in Treatment: 2 Visit Information History Since Last Visit Added or deleted any medications: No Patient Arrived: Ambulatory Any new allergies or adverse reactions: No Arrival Time: 15:09 Had a fall or experienced change in No Accompanied By: aunt activities of daily living that may affect Transfer Assistance: None risk of falls: Patient Identification Verified: Yes Signs or symptoms of abuse/neglect since last visito No Secondary Verification Process Completed: Yes Hospitalized since last visit: No Patient Requires Transmission-Based Precautions: No Implantable device outside of the clinic excluding No Patient Has Alerts: No cellular tissue based products placed in the center since last visit: Has Dressing in Place as Prescribed: Yes Pain Present Now: Yes Electronic Signature(s) Signed: 05/06/2021 5:46:25 PM By: Zandra Abts RN, BSN Entered By: Zandra Abts on 05/05/2021 15:23:13 -------------------------------------------------------------------------------- Encounter Discharge Information Details Patient Name: Date of Service: [name] 05/05/2021 2:45 PM Medical Record Number: [id number] Patient Account Number: [id number] Date of Birth/Sex: Treating RN: 01-13-1956 (65 y.o. F) Rolan Lipa Primary Care Lailana Shira: O'DO Consuello Closs Other Clinician: Referring Avnoor Koury: Treating Grabiela Wohlford/Extender: Geralyn Corwin O'DO [name], [name] Weeks in Treatment: 2 Encounter Discharge Information Items Post Procedure Vitals Discharge  Condition: Stable Temperature (F): 98.5 Ambulatory Status: Ambulatory Pulse (bpm): 71 Discharge Destination: Home Respiratory Rate (breaths/min): 16 Transportation: Private Auto Blood Pressure (mmHg): 156/90 Accompanied By: mother Schedule Follow-up Appointment: No Clinical Summary of Care: Provided Form Type Recipient Paper Patient patient Electronic Signature(s) Signed: 05/07/2021 8:38:46 AM By: Rolan Lipa Entered By: Rolan Lipa on 05/05/2021 16:40:21 -------------------------------------------------------------------------------- Lower Extremity Assessment Details Patient Name: Date of Service: [name], [name] 05/05/2021 2:45 PM Medical Record Number: [id number] Patient Account Number: [id number] Date of Birth/Sex: Treating RN: 1956-09-10 (65 y.o. Wynelle Link Primary Care Darriana Deboy: O'DO Consuello Closs Other Clinician: Referring Ebany Bowermaster: Treating Delon Revelo/Extender: Geralyn Corwin O'DO [name], [name] Weeks in Treatment: 2 Edema Assessment Assessed: [Left: No] [Right: No] Edema: [Left: Ye] [Right: s] Calf Left: Right: Point of Measurement: 31 cm From Medial Instep 31 cm Ankle Left: Right: Point of Measurement: 8 cm From Medial Instep 20 cm Vascular Assessment Pulses: Dorsalis Pedis Palpable: [Right:Yes] Electronic Signature(s) Signed: 05/06/2021 5:46:25 PM By: Zandra Abts RN, BSN Entered By: Zandra Abts on 05/05/2021 15:24:20 -------------------------------------------------------------------------------- Multi Wound Chart Details Patient Name: Date of Service: [name] 05/05/2021 2:45 PM Medical Record Number: [id number] Patient Account Number: [id number] Date of Birth/Sex: Treating RN: 02/02/1956 (65 y.o. [name], [name] Primary Care Kinta Martis: O'DO Consuello Closs Other Clinician: Referring Tieasha Larsen: Treating Bijou Easler/Extender: Geralyn Corwin O'DO [name], [name] Weeks in Treatment: 2 Vital Signs Height(in):  64 Pulse(bpm): 71 Weight(lbs): 125 Blood Pressure(mmHg): 156/90 Body Mass Index(BMI): 21 Temperature(F): 98.5 Respiratory Rate(breaths/min): 16 Photos: [1:Right, Anterior Lower Leg] [N/A:N/A N/A] Wound Location: [1:Trauma] [N/A:N/A] Wounding Event: [1:Dehisced Wound] [N/A:N/A] Primary Etiology: [1:Hypertension, Osteoarthritis] [N/A:N/A] Comorbid History: [1:04/16/2021] [N/A:N/A] Date Acquired: [1:2] [N/A:N/A] Weeks of Treatment: [1:Open] [N/A:N/A] Wound Status: [1:3x1.8x0.1] [N/A:N/A] Measurements L x W x D (cm) [1:4.241] [N/A:N/A] A (cm) : rea [1:0.424] [N/A:N/A] Volume (cm) : [1:82.00%] [N/A:N/A] % Reduction in A rea: [1:82.00%] [N/A:N/A] % Reduction in Volume: [1:Full Thickness Without Exposed] [N/A:N/A] Classification: [1:Support Structures Medium] [N/A:N/A] Exudate A mount: [  1:Serosanguineous] [N/A:N/A] Exudate Type: [1:red, brown] [N/A:N/A] Exudate Color: [1:Distinct, outline attached] [N/A:N/A] Wound Margin: [1:None Present (0%)] [N/A:N/A] Granulation A mount: [1:Large (67-100%)] [N/A:N/A] Necrotic A mount: [1:Fat Layer (Subcutaneous Tissue): Yes N/A] Exposed Structures: [1:Fascia: No Tendon: No Muscle: No Joint: No Bone: No Medium (34-66%)] [N/A:N/A] Epithelialization: [1:Debridement - Excisional] [N/A:N/A] Debridement: Pre-procedure Verification/Time Out 15:52 [N/A:N/A] Taken: [1:Lidocaine 4% Topical Solution] [N/A:N/A] Pain Control: [1:Subcutaneous, Slough] [N/A:N/A] Tissue Debrided: [1:Skin/Subcutaneous Tissue] [N/A:N/A] Level: [1:5.4] [N/A:N/A] Debridement A (sq cm): [1:rea Curette] [N/A:N/A] Instrument: [1:Minimum] [N/A:N/A] Bleeding: [1:Pressure] [N/A:N/A] Hemostasis A chieved: [1:0] [N/A:N/A] Procedural Pain: [1:3] [N/A:N/A] Post Procedural Pain: [1:Procedure was tolerated well] [N/A:N/A] Debridement Treatment Response: [1:3x1.8x1] [N/A:N/A] Post Debridement Measurements L x W x D (cm) [1:4.241] [N/A:N/A] Post Debridement Volume: (cm) [1:Debridement]  [N/A:N/A] Treatment Notes Wound #1 (Lower Leg) Wound Laterality: Right, Anterior Cleanser Soap and Water Discharge Instruction: May shower and wash wound with dial antibacterial soap and water prior to dressing change. Peri-Wound Care Topical Primary Dressing Santyl Ointment Discharge Instruction: Apply nickel thick amount to wound bed as instructed Secondary Dressing Woven Gauze Sponge, Non-Sterile 4x4 in Discharge Instruction: Apply over primary dressing as directed. Zetuvit Plus Silicone Border Dressing 4x4 (in/in) Discharge Instruction: ***patient to go to [facility] for bordered foam.***Apply silicone border over primary dressing as directed. Secured With Compression Wrap Compression Stockings Facilities manager) Signed: 05/05/2021 5:18:50 PM By: Geralyn Corwin DO Signed: 05/05/2021 5:26:08 PM By: Fonnie Mu RN Entered By: Geralyn Corwin on 05/05/2021 17:09:14 -------------------------------------------------------------------------------- Multi-Disciplinary Care Plan Details Patient Name: Date of Service: [name], [name] 05/05/2021 2:45 PM Medical Record Number: [id number] Patient Account Number: [id number] Date of Birth/Sex: Treating RN: [birth year] (65 y.o. [name] Primary Care Mahsa Hanser: O'DO Consuello Closs Other Clinician: Referring Aliany Fiorenza: Treating Darrien Belter/Extender: Geralyn Corwin O'DO [name], [name] Weeks in Treatment: 2 Active Inactive Pain, Acute or Chronic Nursing Diagnoses: Pain, acute or chronic: actual or potential Potential alteration in comfort, pain Goals: Patient will verbalize adequate pain control and receive pain control interventions during procedures as needed Date Initiated: 04/16/2021 Target Resolution Date: 05/21/2021 Goal Status: Active Patient/caregiver will verbalize adequate pain control between visits Date Initiated: 04/16/2021 Target Resolution Date: 05/21/2021 Goal Status: Active Interventions: Encourage patient  to take pain medications as prescribed Provide education on pain management Reposition patient for comfort Treatment Activities: Administer pain control measures as ordered : 04/16/2021 Notes: Wound/Skin Impairment Nursing Diagnoses: Knowledge deficit related to ulceration/compromised skin integrity Goals: Patient/caregiver will verbalize understanding of skin care regimen Date Initiated: 04/16/2021 Target Resolution Date: 05/22/2021 Goal Status: Active Interventions: Assess patient/caregiver ability to obtain necessary supplies Assess patient/caregiver ability to perform ulcer/skin care regimen upon admission and as needed Provide education on ulcer and skin care Treatment Activities: Skin care regimen initiated : 04/16/2021 Topical wound management initiated : 04/16/2021 Notes: Electronic Signature(s) Signed: 05/05/2021 4:54:56 PM By: Shawn Stall Entered By: Shawn Stall on 05/05/2021 15:42:50 -------------------------------------------------------------------------------- Pain Assessment Details Patient Name: Date of Service: [name], [name] 05/05/2021 2:45 PM Medical Record Number: [id number] Patient Account Number: [id number] Date of Birth/Sex: Treating RN: [birth year] (65 y.o. Wynelle Link Primary Care Shawne Eskelson: O'DO Consuello Closs Other Clinician: Referring Sinclair Arrazola: Treating Kourtni Stineman/Extender: Geralyn Corwin O'DO [name], [name] Weeks in Treatment: 2 Active Problems Location of Pain Severity and Description of Pain Patient Has Paino No Site Locations Pain Management and Medication Current Pain Management: Electronic Signature(s) Signed: 05/06/2021 5:46:25 PM By: Zandra Abts RN, BSN Entered By: Zandra Abts on 05/05/2021 15:23:36 -------------------------------------------------------------------------------- Patient/Caregiver Education Details Patient Name: Date of  Service: [name], [name] 6/21/2022andnbsp2:45 PM Medical Record Number: [id number] Patient  Account Number: [id number] Date of Birth/Gender: Treating RN: 1956/05/22 (65 y.o. [name] Primary Care Physician: O'DO Consuello Closs Other Clinician: Referring Physician: Treating Physician/Extender: Geralyn Corwin O'DO [name], Pricilla Larsson in Treatment: 2 Education Assessment Education Provided To: Patient Education Topics Provided Pain: Handouts: A Guide to Pain Control Methods: Explain/Verbal Responses: Reinforcements needed Electronic Signature(s) Signed: 05/05/2021 4:54:56 PM By: Shawn Stall Entered By: Shawn Stall on 05/05/2021 15:43:07 -------------------------------------------------------------------------------- Wound Assessment Details Patient Name: Date of Service: [name], [name] 05/05/2021 2:45 PM Medical Record Number: [id number] Patient Account Number: [id number] Date of Birth/Sex: Treating RN: 11-12-1956 (65 y.o. Wynelle Link Primary Care Davionne Dowty: O'DO Consuello Closs Other Clinician: Referring Halie Gass: Treating Booker Bhatnagar/Extender: Geralyn Corwin O'DO [name], [name] Weeks in Treatment: 2 Wound Status Wound Number: 1 Primary Etiology: Dehisced Wound Wound Location: Right, Anterior Lower Leg Wound Status: Open Wounding Event: Trauma Comorbid History: Hypertension, Osteoarthritis Date Acquired: 04/16/2021 Weeks Of Treatment: 2 Clustered Wound: No Photos Wound Measurements Length: (cm) 3 Width: (cm) 1.8 Depth: (cm) 0.1 Area: (cm) 4.241 Volume: (cm) 0.424 % Reduction in Area: 82% % Reduction in Volume: 82% Epithelialization: Medium (34-66%) Tunneling: No Undermining: No Wound Description Classification: Full Thickness Without Exposed Support Structures Wound Margin: Distinct, outline attached Exudate Amount: Medium Exudate Type: Serosanguineous Exudate Color: red, brown Foul Odor After Cleansing: No Slough/Fibrino Yes Wound Bed Granulation Amount: None Present (0%) Exposed Structure Necrotic Amount: Large  (67-100%) Fascia Exposed: No Necrotic Quality: Adherent Slough Fat Layer (Subcutaneous Tissue) Exposed: Yes Tendon Exposed: No Muscle Exposed: No Joint Exposed: No Bone Exposed: No Treatment Notes Wound #1 (Lower Leg) Wound Laterality: Right, Anterior Cleanser Soap and Water Discharge Instruction: May shower and wash wound with dial antibacterial soap and water prior to dressing change. Peri-Wound Care Topical Primary Dressing Santyl Ointment Discharge Instruction: Apply nickel thick amount to wound bed as instructed Secondary Dressing Woven Gauze Sponge, Non-Sterile 4x4 in Discharge Instruction: Apply over primary dressing as directed. Zetuvit Plus Silicone Border Dressing 4x4 (in/in) Discharge Instruction: ***[facility].***Apply silicone border over primary dressing as directed. Secured With Compression Wrap Compression Stockings Facilities manager) Signed: 05/05/2021 4:19:49 PM By: Karl Ito Signed: 05/06/2021 5:46:25 PM By: Zandra Abts RN, BSN Entered By: Karl Ito on 05/05/2021 16:17:18 -------------------------------------------------------------------------------- Vitals Details Patient Name: Date of Service: [name], [name] 05/05/2021 2:45 PM Medical Record Number: [id number] Patient Account Number: [id number] Date of Birth/Sex: Treating RN: 12-25-1955 (65 y.o. Wynelle Link Primary Care Quan Cybulski: O'DO Consuello Closs Other Clinician: Referring Vinal Rosengrant: Treating Levora Werden/Extender: Geralyn Corwin O'DO [name], [name] Weeks in Treatment: 2 Vital Signs Time Taken: 15:09 Temperature (F): 98.5 Height (in): 64 Pulse (bpm): 71 Weight (lbs): 125 Respiratory Rate (breaths/min): 16 Body Mass Index (BMI): 21.5 Blood Pressure (mmHg): 156/90 Reference Range: 80 - 120 mg / dl Electronic Signature(s) Signed: 05/06/2021 5:46:25 PM By: Zandra Abts RN, BSN Entered By: Zandra Abts on 05/05/2021 15:23:30

## 2021-05-12 ENCOUNTER — Other Ambulatory Visit: Payer: Self-pay

## 2021-05-12 ENCOUNTER — Encounter (HOSPITAL_BASED_OUTPATIENT_CLINIC_OR_DEPARTMENT_OTHER): Payer: Self-pay | Admitting: Internal Medicine

## 2021-05-12 DIAGNOSIS — L97818 Non-pressure chronic ulcer of other part of right lower leg with other specified severity: Secondary | ICD-10-CM
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
## 2021-05-13 NOTE — Progress Notes (Signed)
[name] ([id number]) Visit Report for 05/12/2021 Arrival Information Details Patient Name: Date of Service: [name], [name] 05/12/2021 3:00 PM Medical Record Number: [id number] Patient Account Number: [id number] Date of Birth/Sex: Treating RN: [birth year] (65 y.o. F) Zenaida Deed Primary Care Kenitha Glendinning: O'DO Consuello Closs Other Clinician: Referring Wendelin Reader: Treating Myrian Botello/Extender: Geralyn Corwin O'DO [name], [name] Weeks in Treatment: 3 Visit Information History Since Last Visit Added or deleted any medications: No Patient Arrived: Ambulatory Any new allergies or adverse reactions: No Arrival Time: 15:10 Had a fall or experienced change in No Accompanied By: self activities of daily living that may affect Transfer Assistance: None risk of falls: Patient Identification Verified: Yes Signs or symptoms of abuse/neglect since last visito No Secondary Verification Process Completed: Yes Hospitalized since last visit: No Patient Requires Transmission-Based Precautions: No Implantable device outside of the clinic excluding No Patient Has Alerts: No cellular tissue based products placed in the center since last visit: Has Dressing in Place as Prescribed: Yes Pain Present Now: No Electronic Signature(s) Signed: 05/12/2021 4:32:22 PM By: Zenaida Deed RN, BSN Entered By: Zenaida Deed on 05/12/2021 15:16:33 -------------------------------------------------------------------------------- Encounter Discharge Information Details Patient Name: Date of Service: [name] 05/12/2021 3:00 PM Medical Record Number: [id number] Patient Account Number: [id number] Date of Birth/Sex: Treating RN: 01-27-1956 (65 y.o. [name] Primary Care Starlyn Droge: O'DO Consuello Closs Other Clinician: Referring Destenee Guerry: Treating Ouita Nish/Extender: Geralyn Corwin O'DO [name], [name] Weeks in Treatment: 3 Encounter Discharge Information Items Post Procedure Vitals Discharge  Condition: Stable Temperature (F): 98.6 Ambulatory Status: Ambulatory Pulse (bpm): 77 Discharge Destination: Home Respiratory Rate (breaths/min): 18 Transportation: Private Auto Blood Pressure (mmHg): 151/83 Accompanied By: self Schedule Follow-up Appointment: Yes Clinical Summary of Care: Electronic Signature(s) Signed: 05/12/2021 4:38:16 PM By: Shawn Stall Entered By: Shawn Stall on 05/12/2021 15:58:38 -------------------------------------------------------------------------------- Lower Extremity Assessment Details Patient Name: Date of Service: Gill, [name] 05/12/2021 3:00 PM Medical Record Number: [id number] Patient Account Number: [id number] Date of Birth/Sex: Treating RN: [birth year] (65 y.o. [name] Primary Care Brandan Robicheaux: O'DO Consuello Closs Other Clinician: Referring Marilou Barnfield: Treating Dejanira Pamintuan/Extender: Geralyn Corwin O'DO [name], [name] Weeks in Treatment: 3 Edema Assessment Assessed: [Left: No] [Right: No] Edema: [Left: Ye] [Right: s] Calf Left: Right: Point of Measurement: 31 cm From Medial Instep 30 cm Ankle Left: Right: Point of Measurement: 8 cm From Medial Instep 19.5 cm Electronic Signature(s) Signed: 05/12/2021 4:32:22 PM By: Zenaida Deed RN, BSN Entered By: Zenaida Deed on 05/12/2021 15:24:49 -------------------------------------------------------------------------------- Multi Wound Chart Details Patient Name: Date of Service: [name] 05/12/2021 3:00 PM Medical Record Number: [id number] Patient Account Number: [id number] Date of Birth/Sex: Treating RN: Apr 19, 1956 (65 y.o. [name] Primary Care Aldin Drees: O'DO Consuello Closs Other Clinician: Referring Flem Enderle: Treating Nicolina Hirt/Extender: Geralyn Corwin O'DO [name], [name] Weeks in Treatment: 3 Vital Signs Height(in): 64 Pulse(bpm): 77 Weight(lbs): 125 Blood Pressure(mmHg): 151/83 Body Mass Index(BMI): 21 Temperature(F): 98.6 Respiratory  Rate(breaths/min): 18 Photos: [N/A:N/A] Right, Anterior Lower Leg N/A N/A Wound Location: Trauma N/A N/A Wounding Event: Dehisced Wound N/A N/A Primary Etiology: Hypertension, Osteoarthritis N/A N/A Comorbid History: 04/16/2021 N/A N/A Date Acquired: 3 N/A N/A Weeks of Treatment: Open N/A N/A Wound Status: 1.8x1.3x0.1 N/A N/A Measurements L x W x D (cm) 1.838 N/A N/A A (cm) : rea 0.184 N/A N/A Volume (cm) : 92.20% N/A N/A % Reduction in Area: 92.20% N/A N/A % Reduction in Volume: Full Thickness Without Exposed N/A N/A Classification: Support Structures Small N/A N/A Exudate A mount: Serosanguineous  N/A N/A Exudate Type: red, brown N/A N/A Exudate Color: Distinct, outline attached N/A N/A Wound Margin: Small (1-33%) N/A N/A Granulation A mount: Pink N/A N/A Granulation Quality: Large (67-100%) N/A N/A Necrotic A mount: Fat Layer (Subcutaneous Tissue): Yes N/A N/A Exposed Structures: Fascia: No Tendon: No Muscle: No Joint: No Bone: No Medium (34-66%) N/A N/A Epithelialization: Debridement - Selective/Open Wound N/A N/A Debridement: Pre-procedure Verification/Time Out 15:50 N/A N/A Taken: Lidocaine 4% Topical Solution N/A N/A Pain Control: Slough N/A N/A Tissue Debrided: Non-Viable Tissue N/A N/A Level: 0.3 N/A N/A Debridement A (sq cm): rea Curette N/A N/A Instrument: None N/A N/A Bleeding: Pressure N/A N/A Hemostasis A chieved: 0 N/A N/A Procedural Pain: 0 N/A N/A Post Procedural Pain: Procedure was tolerated well N/A N/A Debridement Treatment Response: 1.8x1.3x0.1 N/A N/A Post Debridement Measurements L x W x D (cm) 0.184 N/A N/A Post Debridement Volume: (cm) Debridement N/A N/A Procedures Performed: Treatment Notes Wound #1 (Lower Leg) Wound Laterality: Right, Anterior Cleanser Soap and Water Discharge Instruction: May shower and wash wound with dial antibacterial soap and water prior to dressing change. Peri-Wound  Care Topical Primary Dressing Santyl Ointment Discharge Instruction: Apply nickel thick amount to wound bed as instructed Secondary Dressing Woven Gauze Sponge, Non-Sterile 4x4 in Discharge Instruction: Apply over primary dressing as directed. Zetuvit Plus Silicone Border Dressing 4x4 (in/in) Discharge Instruction: ***patient to go to [facility] for bordered foam.***Apply silicone border over primary dressing as directed. Secured With Compression Wrap Compression Stockings Facilities managerAdd-Ons Electronic Signature(s) Signed: 05/12/2021 4:53:11 PM By: Geralyn CorwinHoffman, Jessica DO Signed: 05/13/2021 6:10:10 PM By: Shawn Stalleaton, Bobbi Entered By: Geralyn CorwinHoffman, Jessica on 05/12/2021 16:42:02 -------------------------------------------------------------------------------- Multi-Disciplinary Care Plan Details Patient Name: Date of Service: [name] EwingLUSTY, LA URA 05/12/2021 3:00 PM Medical Record Number: [id number] Patient Account Number: [id number] Date of Birth/Sex: Treating RN: 03/03/1956 (65 y.o. Arta SilenceF) Deaton, Bobbi Primary Care Takelia Urieta: O'DO Consuello ClossNNELL, [name] Other Clinician: Referring Shebra Muldrow: Treating Jolanta Cabeza/Extender: Geralyn CorwinHoffman, Jessica O'DO [name], [name] Weeks in Treatment: 3 Active Inactive Pain, Acute or Chronic Nursing Diagnoses: Pain, acute or chronic: actual or potential Potential alteration in comfort, pain Goals: Patient will verbalize adequate pain control and receive pain control interventions during procedures as needed Date Initiated: 04/16/2021 Target Resolution Date: 05/21/2021 Goal Status: Active Patient/caregiver will verbalize adequate pain control between visits Date Initiated: 04/16/2021 Target Resolution Date: 05/21/2021 Goal Status: Active Interventions: Encourage patient to take pain medications as prescribed Provide education on pain management Reposition patient for comfort Treatment Activities: Administer pain control measures as ordered : 04/16/2021 Notes: Wound/Skin Impairment Nursing  Diagnoses: Knowledge deficit related to ulceration/compromised skin integrity Goals: Patient/caregiver will verbalize understanding of skin care regimen Date Initiated: 04/16/2021 Target Resolution Date: 05/22/2021 Goal Status: Active Interventions: Assess patient/caregiver ability to obtain necessary supplies Assess patient/caregiver ability to perform ulcer/skin care regimen upon admission and as needed Provide education on ulcer and skin care Treatment Activities: Skin care regimen initiated : 04/16/2021 Topical wound management initiated : 04/16/2021 Notes: Electronic Signature(s) Signed: 05/12/2021 4:38:16 PM By: Shawn Stalleaton, Bobbi Entered By: Shawn Stalleaton, Bobbi on 05/12/2021 15:44:18 -------------------------------------------------------------------------------- Pain Assessment Details Patient Name: Date of Service: [name] EwingLUSTY, LA URA 05/12/2021 3:00 PM Medical Record Number: [id number] Patient Account Number: [id number] Date of Birth/Sex: Treating RN: 02/19/1956 (65 y.o. Tommye StandardF) Boehlein, Linda Primary Care Melania Kirks: O'DO Consuello ClossNNELL, [name] Other Clinician: Referring Caitlin Hillmer: Treating Xitlalic Maslin/Extender: Geralyn CorwinHoffman, Jessica O'DO [name], [name] Weeks in Treatment: 3 Active Problems Location of Pain Severity and Description of Pain Patient Has Paino Yes Site Locations Pain Location: Pain in Ulcers With Dressing  Change: Yes Duration of the Pain. Constant / Intermittento Intermittent Rate the pain. Current Pain Level: 6 Worst Pain Level: 7 Least Pain Level: 0 Character of Pain Describe the Pain: Aching Pain Management and Medication Current Pain Management: Medication: Yes Is the Current Pain Management Adequate: Adequate Rest: Yes How does your wound impact your activities of daily livingo Sleep: Yes Bathing: No Appetite: No Relationship With Others: No Bladder Continence: No Emotions: No Bowel Continence: No Work: No Toileting: No Drive: No Dressing: No Hobbies: No Investment banker, corporate) Signed: 05/12/2021 4:32:22 PM By: Zenaida Deed RN, BSN Entered By: Zenaida Deed on 05/12/2021 15:24:05 -------------------------------------------------------------------------------- Patient/Caregiver Education Details Patient Name: Date of Service: [name] 6/28/2022andnbsp3:00 PM Medical Record Number: [id number] Patient Account Number: [id number] Date of Birth/Gender: Treating RN: 01/08/1956 (65 y.o. [name] Primary Care Physician: O'DO Consuello Closs Other Clinician: Referring Physician: Treating Physician/Extender: Geralyn Corwin O'DO [name], Pricilla Larsson in Treatment: 3 Education Assessment Education Provided To: Patient Education Topics Provided Pain: Handouts: A Guide to Pain Control Methods: Explain/Verbal Responses: Reinforcements needed Electronic Signature(s) Signed: 05/12/2021 4:38:16 PM By: Shawn Stall Entered By: Shawn Stall on 05/12/2021 15:44:28 -------------------------------------------------------------------------------- Wound Assessment Details Patient Name: Date of Service: ALEXINA, NICCOLI 05/12/2021 3:00 PM Medical Record Number: [id number] Patient Account Number: [id number] Date of Birth/Sex: Treating RN: 1956/09/14 (65 y.o. [name] Primary Care Younis Mathey: O'DO Consuello Closs Other Clinician: Referring Berwyn Bigley: Treating Anieya Helman/Extender: Geralyn Corwin O'DO [name], [name] Weeks in Treatment: 3 Wound Status Wound Number: 1 Primary Etiology: Dehisced Wound Wound Location: Right, Anterior Lower Leg Wound Status: Open Wounding Event: Trauma Comorbid History: Hypertension, Osteoarthritis Date Acquired: 04/16/2021 Weeks Of Treatment: 3 Clustered Wound: No Photos Wound Measurements Length: (cm) 1.8 Width: (cm) 1.3 Depth: (cm) 0.1 Area: (cm) 1.838 Volume: (cm) 0.184 % Reduction in Area: 92.2% % Reduction in Volume: 92.2% Epithelialization: Medium (34-66%) Tunneling: No Undermining:  No Wound Description Classification: Full Thickness Without Exposed Support Structures Wound Margin: Distinct, outline attached Exudate Amount: Small Exudate Type: Serosanguineous Exudate Color: red, brown Foul Odor After Cleansing: No Slough/Fibrino Yes Wound Bed Granulation Amount: Small (1-33%) Exposed Structure Granulation Quality: Pink Fascia Exposed: No Necrotic Amount: Large (67-100%) Fat Layer (Subcutaneous Tissue) Exposed: Yes Necrotic Quality: Adherent Slough Tendon Exposed: No Muscle Exposed: No Joint Exposed: No Bone Exposed: No Treatment Notes Wound #1 (Lower Leg) Wound Laterality: Right, Anterior Cleanser Soap and Water Discharge Instruction: May shower and wash wound with dial antibacterial soap and water prior to dressing change. Peri-Wound Care Topical Primary Dressing Santyl Ointment Discharge Instruction: Apply nickel thick amount to wound bed as instructed Secondary Dressing Woven Gauze Sponge, Non-Sterile 4x4 in Discharge Instruction: Apply over primary dressing as directed. Zetuvit Plus Silicone Border Dressing 4x4 (in/in) Discharge Instruction: ***[facility].***Apply silicone border over primary dressing as directed. Secured With Compression Wrap Compression Stockings Facilities manager) Signed: 05/12/2021 4:39:21 PM By: Karl Ito Signed: 05/13/2021 5:41:46 PM By: Zenaida Deed RN, BSN Previous Signature: 05/12/2021 4:32:22 PM Version By: Zenaida Deed RN, BSN Entered By: Karl Ito on 05/12/2021 16:36:50 -------------------------------------------------------------------------------- Vitals Details Patient Name: Date of Service: RENELDA, KILIAN 05/12/2021 3:00 PM Medical Record Number: [id number] Patient Account Number: [id number] Date of Birth/Sex: Treating RN: 1956-06-30 (65 y.o. [name] Primary Care Trystin Hargrove: O'DO Consuello Closs Other Clinician: Referring Huber Mathers: Treating  Jaime Grizzell/Extender: Geralyn Corwin O'DO [name], [name] Weeks in Treatment: 3 Vital Signs Time Taken: 15:16 Temperature (F): 98.6 Height (in): 64 Pulse (  bpm): 77 Source: Stated Respiratory Rate (breaths/min): 18 Weight (lbs): 125 Blood Pressure (mmHg): 151/83 Source: Stated Reference Range: 80 - 120 mg / dl Body Mass Index (BMI): 21.5 Electronic Signature(s) Signed: 05/12/2021 4:32:22 PM By: Zenaida Deed RN, BSN Entered By: Zenaida Deed on 05/12/2021 15:17:43

## 2021-05-26 ENCOUNTER — Encounter (HOSPITAL_BASED_OUTPATIENT_CLINIC_OR_DEPARTMENT_OTHER): Payer: Self-pay | Attending: Internal Medicine | Admitting: Internal Medicine

## 2021-05-26 ENCOUNTER — Other Ambulatory Visit: Payer: Self-pay

## 2021-05-26 DIAGNOSIS — I890 Lymphedema, not elsewhere classified: Secondary | ICD-10-CM | POA: Insufficient documentation

## 2021-05-26 DIAGNOSIS — L97818 Non-pressure chronic ulcer of other part of right lower leg with other specified severity: Secondary | ICD-10-CM | POA: Insufficient documentation

## 2021-05-26 DIAGNOSIS — S80811D Abrasion, right lower leg, subsequent encounter: Secondary | ICD-10-CM

## 2021-05-26 DIAGNOSIS — Z881 Allergy status to other antibiotic agents status: Secondary | ICD-10-CM | POA: Insufficient documentation

## 2021-05-26 DIAGNOSIS — Z8249 Family history of ischemic heart disease and other diseases of the circulatory system: Secondary | ICD-10-CM | POA: Insufficient documentation

## 2021-05-26 DIAGNOSIS — Y9341 Activity, dancing: Secondary | ICD-10-CM | POA: Insufficient documentation
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
## 2021-06-01 NOTE — Progress Notes (Signed)
[name], Patrese ([id number]) Visit Report for 05/26/2021 Arrival Information Details Patient Name: Date of Service: [name], LA URA 05/26/2021 2:30 PM Medical Record Number: [id number] Patient Account Number: [id number] Date of Birth/Sex: Treating RN: 11/07/1956 (65 y.o. Wynelle LinkF) Lynch, Shatara Primary Care Coleson Kant: O'DO Consuello ClossNNELL, [name] Other Clinician: Referring Jago Carton: Treating Anwitha Mapes/Extender: Geralyn CorwinHoffman, Jessica O'DO [name], [name] Weeks in Treatment: 5 Visit Information History Since Last Visit Added or deleted any medications: No Patient Arrived: Ambulatory Any new allergies or adverse reactions: No Arrival Time: 14:49 Had a fall or experienced change in No Accompanied By: self activities of daily living that may affect Transfer Assistance: None risk of falls: Patient Identification Verified: Yes Signs or symptoms of abuse/neglect since last visito No Secondary Verification Process Completed: Yes Hospitalized since last visit: No Patient Requires Transmission-Based Precautions: No Implantable device outside of the clinic excluding No Patient Has Alerts: No cellular tissue based products placed in the center since last visit: Has Dressing in Place as Prescribed: Yes Pain Present Now: No Electronic Signature(s) Signed: 06/01/2021 3:49:49 PM By: Karl Itoawkins, Destiny Entered By: Karl Itoawkins, Destiny on 05/26/2021 14:50:29 -------------------------------------------------------------------------------- Clinic Level of Care Assessment Details Patient Name: Date of Service: [name], LA URA 05/26/2021 2:30 PM Medical Record Number: [id number] Patient Account Number: [id number] Date of Birth/Sex: Treating RN: 01/26/1956 (65 y.o. Wynelle LinkF) Lynch, Shatara Primary Care Naya Ilagan: O'DO Consuello ClossNNELL, [name] Other Clinician: Referring Ilyse Tremain: Treating Darcelle Herrada/Extender: Geralyn CorwinHoffman, Jessica O'DO [name], [name] Weeks in Treatment: 5 Clinic Level of Care Assessment Items TOOL 4 Quantity Score X- 1 0 Use when only  an EandM is performed on FOLLOW-UP visit ASSESSMENTS - Nursing Assessment / Reassessment X- 1 10 Reassessment of Co-morbidities (includes updates in patient status) X- 1 5 Reassessment of Adherence to Treatment Plan ASSESSMENTS - Wound and Skin A ssessment / Reassessment X - Simple Wound Assessment / Reassessment - one wound 1 5 []  - 0 Complex Wound Assessment / Reassessment - multiple wounds []  - 0 Dermatologic / Skin Assessment (not related to wound area) ASSESSMENTS - Focused Assessment []  - 0 Circumferential Edema Measurements - multi extremities []  - 0 Nutritional Assessment / Counseling / Intervention X- 1 5 Lower Extremity Assessment (monofilament, tuning fork, pulses) []  - 0 Peripheral Arterial Disease Assessment (using hand held doppler) ASSESSMENTS - Ostomy and/or Continence Assessment and Care []  - 0 Incontinence Assessment and Management []  - 0 Ostomy Care Assessment and Management (repouching, etc.) PROCESS - Coordination of Care X - Simple Patient / Family Education for ongoing care 1 15 []  - 0 Complex (extensive) Patient / Family Education for ongoing care X- 1 10 Staff obtains ChiropractorConsents, Records, T Results / Process Orders est []  - 0 Staff telephones HHA, Nursing Homes / Clarify orders / etc []  - 0 Routine Transfer to another Facility (non-emergent condition) []  - 0 Routine Hospital Admission (non-emergent condition) []  - 0 New Admissions / Manufacturing engineernsurance Authorizations / Ordering NPWT Apligraf, etc. , []  - 0 Emergency Hospital Admission (emergent condition) X- 1 10 Simple Discharge Coordination []  - 0 Complex (extensive) Discharge Coordination PROCESS - Special Needs []  - 0 Pediatric / Minor Patient Management []  - 0 Isolation Patient Management []  - 0 Hearing / Language / Visual special needs []  - 0 Assessment of Community assistance (transportation, D/C planning, etc.) []  - 0 Additional assistance / Altered mentation []  - 0 Support Surface(s)  Assessment (bed, cushion, seat, etc.) INTERVENTIONS - Wound Cleansing / Measurement X - Simple Wound Cleansing - one wound 1 5 []  - 0 Complex Wound Cleansing -  multiple wounds X- 1 5 Wound Imaging (photographs - any number of wounds) []  - 0 Wound Tracing (instead of photographs) X- 1 5 Simple Wound Measurement - one wound []  - 0 Complex Wound Measurement - multiple wounds INTERVENTIONS - Wound Dressings []  - 0 Small Wound Dressing one or multiple wounds []  - 0 Medium Wound Dressing one or multiple wounds []  - 0 Large Wound Dressing one or multiple wounds []  - 0 Application of Medications - topical []  - 0 Application of Medications - injection INTERVENTIONS - Miscellaneous []  - 0 External ear exam []  - 0 Specimen Collection (cultures, biopsies, blood, body fluids, etc.) []  - 0 Specimen(s) / Culture(s) sent or taken to Lab for analysis []  - 0 Patient Transfer (multiple staff / / Similar devices) []  - 0 Simple Staple / Suture removal (25 or less) []  - 0 Complex Staple / Suture removal (26 or more) []  - 0 Hypo / Hyperglycemic Management (close monitor of Blood Glucose) []  - 0 Ankle / Brachial Index (ABI) - do not check if billed separately X- 1 5 Vital Signs Has the patient been seen at the hospital within the last three years: Yes Total Score: 80 Level Of Care: New/Established - Level 3 Electronic Signature(s) Signed: 05/26/2021 5:43:47 PM By: RN, BSN Entered By: on 05/26/2021 17:32:03 -------------------------------------------------------------------------------- Encounter Discharge Information Details Patient Name: Date of Service: 05/26/2021 2:30 PM Medical Record Number: Patient Account Number: Date of Birth/Sex: Treating RN: Dec 12, 1955 (65 y.o. Nurse, adult Primary Care Roniel Halloran: O'DO Other Clinician: Referring Shamere Dilworth: Treating Joshus Rogan/Extender: O'DO [name], [name] Weeks in Treatment: 5 Encounter Discharge Information Items Discharge Condition: Stable Ambulatory Status: Ambulatory Discharge Destination: Home Transportation: Private Auto Accompanied By: alone Schedule Follow-up Appointment: Yes Clinical Summary of Care: Patient Declined Electronic Signature(s) Signed: 05/26/2021 5:43:47 PM By: RN, BSN Entered By: 07/27/2021 on 05/26/2021 17:32:33 -------------------------------------------------------------------------------- Lower Extremity Assessment Details Patient Name: Date of Service: Gill, [name] 05/26/2021 2:30 PM Medical Record Number: [name] Patient Account Number: 07/27/2021 Date of Birth/Sex: Treating RN: 10-Oct-1956 (65 y.o. 04/23/1956 Primary Care Biridiana Twardowski: O'DO 66 Other Clinician: Referring Eliah Marquard: Treating Kacy Hegna/Extender: Wynelle Link O'DO [name], [name] Weeks in Treatment: 5 Edema Assessment Assessed: [Left: No] [Right: No] Edema: [Left: Ye] [Right: s] Calf Left: Right: Point of Measurement: 31 cm From Medial Instep 30 cm Ankle Left: Right: Point of Measurement: 8 cm From Medial Instep 19.5 cm Vascular Assessment Pulses: Dorsalis Pedis Palpable: [Right:Yes] Electronic Signature(s) Signed: 05/26/2021 5:43:47 PM By: Geralyn Corwin RN, BSN Entered By: 07/27/2021 on 05/26/2021 15:20:53 -------------------------------------------------------------------------------- Multi Wound Chart Details Patient Name: Date of Service: Zandra Abts 05/26/2021 2:30 PM Medical Record Number: [name] Patient Account Number: 07/27/2021 Date of Birth/Sex: Treating RN: 1956-05-10 (65 y.o. 04/23/1956 Primary Care Zanya Lindo: O'DO 66 Other Clinician: Referring Quinterius Gaida: Treating Linkoln Alkire/Extender: Wynelle Link O'DO [name], [name] Weeks in Treatment: 5 Vital Signs Height(in): 64 Pulse(bpm): 78 Weight(lbs): 125 Blood  Pressure(mmHg): 143/88 Body Mass Index(BMI): 21 Temperature(F): 97.4 Respiratory Rate(breaths/min): 18 Photos: [1:No Photos Right, Anterior Lower Leg] [N/A:N/A N/A] Wound Location: [1:Trauma] [N/A:N/A] Wounding Event: [1:Dehisced Wound] [N/A:N/A] Primary Etiology: [1:Hypertension, Osteoarthritis] [N/A:N/A] Comorbid History: [1:04/16/2021] [N/A:N/A] Date Acquired: [1:5] [N/A:N/A] Weeks of Treatment: [1:Healed - Epithelialized] [N/A:N/A] Wound Status: [1:0x0x0] [N/A:N/A] Measurements L x W x D (cm) [1:0] [N/A:N/A] A (cm) : rea [1:0] [N/A:N/A] Volume (cm) : [1:100.00%] [N/A:N/A] %  Reduction in A rea: [1:100.00%] [N/A:N/A] % Reduction in Volume: [1:Full Thickness Without Exposed] [N/A:N/A] Classification: [1:Support Structures None Present] [N/A:N/A] Exudate Amount: [1:Distinct, outline attached] [N/A:N/A] Wound Margin: [1:None Present (0%)] [N/A:N/A] Granulation Amount: [1:None Present (0%)] [N/A:N/A] Necrotic Amount: [1:Fascia: No] [N/A:N/A] Exposed Structures: [1:Fat Layer (Subcutaneous Tissue): No Tendon: No Muscle: No Joint: No Bone: No Large (67-100%)] [N/A:N/A] Treatment Notes Electronic Signature(s) Signed: 05/26/2021 4:09:24 PM By: Geralyn Corwin DO Signed: 05/26/2021 5:43:47 PM By: Zandra Abts RN, BSN Entered By: Geralyn Corwin on 05/26/2021 15:57:43 -------------------------------------------------------------------------------- Multi-Disciplinary Care Plan Details Patient Name: Date of Service: QUINTINA, [name] 05/26/2021 2:30 PM Medical Record Number: [id number] Patient Account Number: [id number] Date of Birth/Sex: Treating RN: 12-26-1955 (65 y.o. Wynelle Link Primary Care Zane Pellecchia: O'DO Consuello Closs Other Clinician: Referring Mayte Diers: Treating Alexsandro Salek/Extender: Geralyn Corwin O'DO [name], [name] Weeks in Treatment: 5 Active Inactive Electronic Signature(s) Signed: 05/26/2021 5:43:47 PM By: Zandra Abts RN, BSN Entered By: Zandra Abts on  05/26/2021 15:28:06 -------------------------------------------------------------------------------- Pain Assessment Details Patient Name: Date of Service: Gill, [name] 05/26/2021 2:30 PM Medical Record Number: [id number] Patient Account Number: [id number] Date of Birth/Sex: Treating RN: [birth year] (65 y.o. Wynelle Link Primary Care Meiling Hendriks: O'DO Consuello Closs Other Clinician: Referring Karliah Kowalchuk: Treating Carrick Rijos/Extender: Geralyn Corwin O'DO [name], [name] Weeks in Treatment: 5 Active Problems Location of Pain Severity and Description of Pain Patient Has Paino No Site Locations Pain Management and Medication Current Pain Management: Electronic Signature(s) Signed: 05/26/2021 5:43:47 PM By: Zandra Abts RN, BSN Signed: 06/01/2021 3:49:49 PM By: Karl Ito Entered By: Karl Ito on 05/26/2021 14:54:14 -------------------------------------------------------------------------------- Patient/Caregiver Education Details Patient Name: Date of Service: [name] 7/12/2022andnbsp2:30 PM Medical Record Number: [id number] Patient Account Number: [id number] Date of Birth/Gender: Treating RN: July 07, 1956 (65 y.o. Wynelle Link Primary Care Physician: O'DO Consuello Closs Other Clinician: Referring Physician: Treating Physician/Extender: Geralyn Corwin O'DO [name], Pricilla Larsson in Treatment: 5 Education Assessment Education Provided To: Patient Education Topics Provided Wound/Skin Impairment: Methods: Explain/Verbal Responses: State content correctly Electronic Signature(s) Signed: 05/26/2021 5:43:47 PM By: Zandra Abts RN, BSN Entered By: Zandra Abts on 05/26/2021 15:28:21 -------------------------------------------------------------------------------- Wound Assessment Details Patient Name: Date of Service: [name], [name] 05/26/2021 2:30 PM Medical Record Number: [id number] Patient Account Number: [id number] Date of Birth/Sex: Treating  RN: [birth year] (65 y.o. Wynelle Link Primary Care Minola Guin: O'DO Consuello Closs Other Clinician: Referring Inice Sanluis: Treating Terea Neubauer/Extender: Geralyn Corwin O'DO [name], [name] Weeks in Treatment: 5 Wound Status Wound Number: 1 Primary Etiology: Dehisced Wound Wound Location: Right, Anterior Lower Leg Wound Status: Healed - Epithelialized Wounding Event: Trauma Comorbid History: Hypertension, Osteoarthritis Date Acquired: 04/16/2021 Weeks Of Treatment: 5 Clustered Wound: No Photos Wound Measurements Length: (cm) Width: (cm) Depth: (cm) Area: (cm) Volume: (cm) 0 % Reduction in Area: 100% 0 % Reduction in Volume: 100% 0 Epithelialization: Large (67-100%) 0 Tunneling: No 0 Undermining: No Wound Description Classification: Full Thickness Without Exposed Support Structures Wound Margin: Distinct, outline attached Exudate Amount: None Present Foul Odor After Cleansing: No Slough/Fibrino No Wound Bed Granulation Amount: None Present (0%) Exposed Structure Necrotic Amount: None Present (0%) Fascia Exposed: No Fat Layer (Subcutaneous Tissue) Exposed: No Tendon Exposed: No Muscle Exposed: No Joint Exposed: No Bone Exposed: No Electronic Signature(s) Signed: 05/26/2021 5:43:47 PM By: Zandra Abts RN, BSN Signed: 06/01/2021 3:49:49 PM By: Karl Ito Entered By: Karl Ito on 05/26/2021 16:30:57 -------------------------------------------------------------------------------- Vitals Details Patient Name: Date of Service: [name] 05/26/2021 2:30 PM Medical Record Number: [id number] Patient Account  Number: [id number] Date of Birth/Sex: Treating RN: 26-Jun-1956 (65 y.o. Wynelle Link Primary Care Nandini Bogdanski: O'DO Consuello Closs Other Clinician: Referring Tiamarie Furnari: Treating Perle Gibbon/Extender: Geralyn Corwin O'DO [name], [name] Weeks in Treatment: 5 Vital Signs Time Taken: 14:52 Temperature (F): 97.4 Height (in): 64 Pulse (bpm):  78 Weight (lbs): 125 Respiratory Rate (breaths/min): 18 Body Mass Index (BMI): 21.5 Blood Pressure (mmHg): 143/88 Reference Range: 80 - 120 mg / dl Electronic Signature(s) Signed: 06/01/2021 3:49:49 PM By: Karl Ito Entered By: Karl Ito on 05/26/2021 14:54:08

## 2021-07-23 ENCOUNTER — Institutional Professional Consult (permissible substitution): Payer: Self-pay | Admitting: Plastic Surgery

## 2021-09-02 ENCOUNTER — Institutional Professional Consult (permissible substitution): Payer: Self-pay | Admitting: Plastic Surgery

## 2021-09-30 ENCOUNTER — Other Ambulatory Visit: Payer: Self-pay

## 2021-09-30 ENCOUNTER — Ambulatory Visit (INDEPENDENT_AMBULATORY_CARE_PROVIDER_SITE_OTHER): Payer: Self-pay | Admitting: Plastic Surgery

## 2021-09-30 DIAGNOSIS — L905 Scar conditions and fibrosis of skin: Secondary | ICD-10-CM

## 2021-09-30 NOTE — Progress Notes (Signed)
   Referring Provider Camelia Phenes, DO [address] [place],  [place] [address]   CC:  Chief Complaint  Patient presents with   Advice Only      [name] is an 65 y.o. female.  HPI: Patient presents to discuss her right lower extremity scar.  She had a trauma to her anterior distal third right lower extremity about 6 months ago.  This created a skin flap that was repaired in the emergency room.  It sounds like some of the superficial layers of skin on the flap portion sloughed off creating an ulcerative wound that did take quite a while to heal.  It has ultimately healed with the help of the wound care center and she is bothered by the scar.  It is mainly a concern with the darker pigment of the scar relative to the surrounding skin.  She is tried hydroquinone with some limited success.  She wants to see if there is any other options.  Allergies  Allergen Reactions   Ciprofloxacin Anaphylaxis    Toxic     Levofloxacin Anaphylaxis   Cephalexin Other (See Comments)    Joint pain, neuropathy    Outpatient Encounter Medications as of 09/30/2021  Medication Sig   oxyCODONE-acetaminophen (PERCOCET/ROXICET) 5-325 MG tablet Take 1 tablet by mouth every 6 (six) hours as needed for severe pain.   No facility-administered encounter medications on file as of 09/30/2021.     Past Medical History:  Diagnosis Date   Anxiety    Arthritis    Depression    Hypertension     No past surgical history on file.  No family history on file.  Social History   Social History Narrative   Not on file     Review of Systems General: Denies fevers, chills, weight loss CV: Denies chest pain, shortness of breath, palpitations  Physical Exam Vitals with BMI 04/05/2021 04/05/2021 04/05/2021  Height - - -  Weight - - -  BMI - - -  Systolic 145 130 616  Diastolic 89 100 94  Pulse 69 70 80    General:  No acute distress,  Alert and oriented, Non-Toxic, Normal speech and  affect Right lower extremity: Foot is well-perfused with a palpable pulse.  There is a curvilinear scar in the distal third of the leg.  This is healed completely.  There is some other darker areas which look to be consistent with the area of skin slough and the flap itself.  There is very mild edema no signs of any deeper infection.  The contour is very smooth.  Assessment/Plan Patient presents with a pigmented scar in the right lower extremity.  I recommended trying some noninvasive measures for this first.  I do think that BBL laser would be a good option for her.  That should address the pigment in a safe way that would not create any further scarring.  I explained that excising the scar or performing a skin graft would likely leave her with a worse outcome cosmetically and may present some healing challenges in that location.  She is in agreement and we will plan to set this up for her.  [name] 09/30/2021, 5:59 PM

## 2021-10-15 ENCOUNTER — Other Ambulatory Visit: Payer: Self-pay | Admitting: Surgical

## 2021-10-28 ENCOUNTER — Ambulatory Visit (INDEPENDENT_AMBULATORY_CARE_PROVIDER_SITE_OTHER): Payer: Self-pay | Admitting: Surgical

## 2021-10-28 ENCOUNTER — Encounter: Payer: Self-pay | Admitting: Surgical

## 2021-10-28 ENCOUNTER — Other Ambulatory Visit: Payer: Self-pay

## 2021-10-28 DIAGNOSIS — L905 Scar conditions and fibrosis of skin: Secondary | ICD-10-CM

## 2021-10-28 NOTE — Progress Notes (Signed)
Preoperative Dx: Hyperpigmented scar  Postoperative Dx:  same  Procedure: laser to right leg   Anesthesia: none  Description of Procedure:  Risks and complications were explained to the patient. Consent was confirmed. Time out was called and all information was confirmed to be correct. The area  area was prepped with alcohol and wiped dry. The BBL sciton laser was set at 8 J/cm2 using 560nm piece. The right leg was lasered. The patient tolerated the procedure well and there were no complications. The patient is to follow up in 4-6 weeks.

## 2021-12-01 ENCOUNTER — Other Ambulatory Visit: Payer: Self-pay | Admitting: Surgical

## 2021-12-08 ENCOUNTER — Other Ambulatory Visit: Payer: Self-pay

## 2021-12-08 ENCOUNTER — Ambulatory Visit (INDEPENDENT_AMBULATORY_CARE_PROVIDER_SITE_OTHER): Payer: Self-pay | Admitting: Surgical

## 2021-12-08 DIAGNOSIS — L905 Scar conditions and fibrosis of skin: Secondary | ICD-10-CM

## 2021-12-08 NOTE — Progress Notes (Signed)
Preoperative Dx: Hypopigmented scar  Postoperative Dx:  same  Procedure: laser to right leg   Anesthesia: none  Description of Procedure:  Risks and complications were explained to the patient. Consent was confirmed and signed. Time out was called and all information was confirmed to be correct. The area  area was prepped with alcohol and wiped dry. The 515 laser was set at 6 J/cm2. The leg was lasered. The 560 laser was set at 9 J/cm2. The leg was lasered. 3 passes with 515, 2 passes with 560 The patient tolerated the procedure well and there were no complications. The patient is to follow up in 4 weeks.

## 2022-01-12 ENCOUNTER — Other Ambulatory Visit: Payer: Self-pay | Admitting: Surgical

## 2022-02-02 IMAGING — DX DG TIBIA/FIBULA 2V*R*
1 series · 2 of 2 positions shown · non-contrast
Comparison: None.

CLINICAL DATA: Right leg pain

EXAM:
RIGHT TIBIA AND FIBULA - 2 VIEW

[Series 1: leg · 0.14mm/px · 2 of 2 slices shown]
[im 1/2]
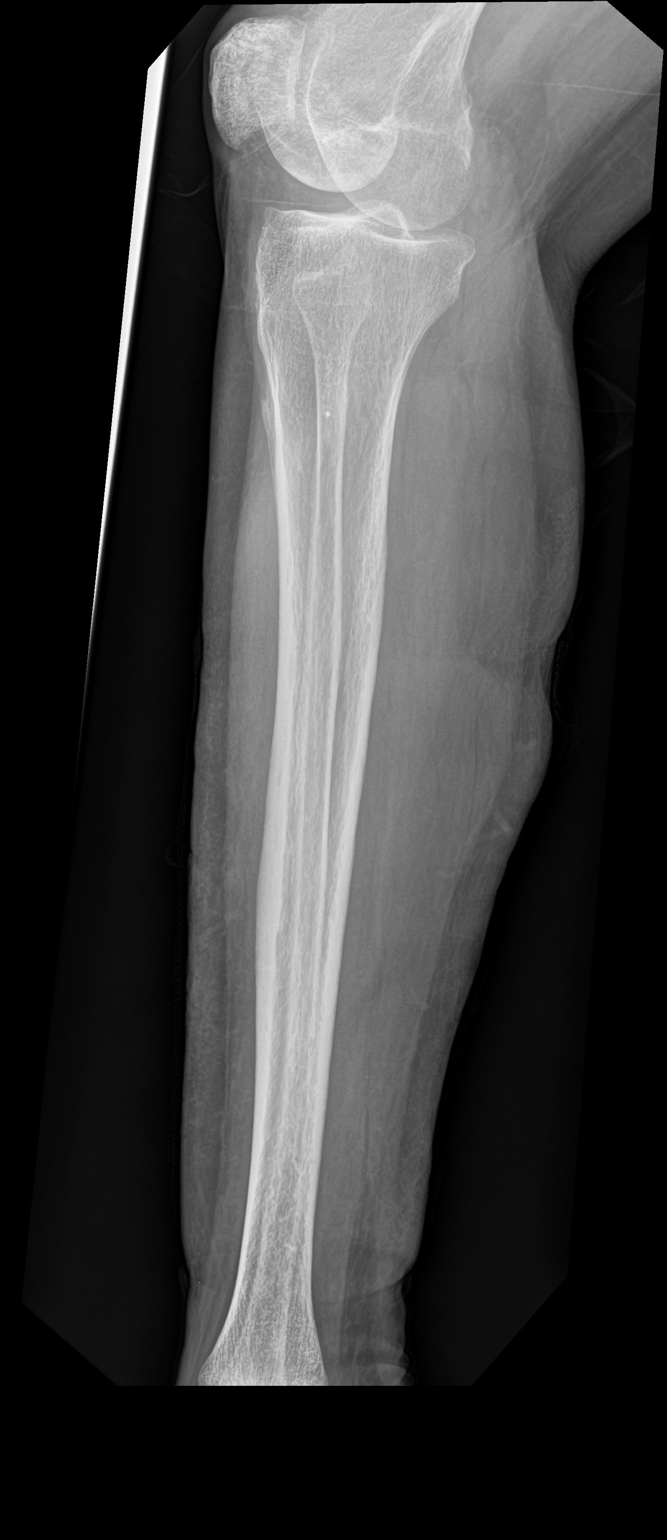
[im 2/2]
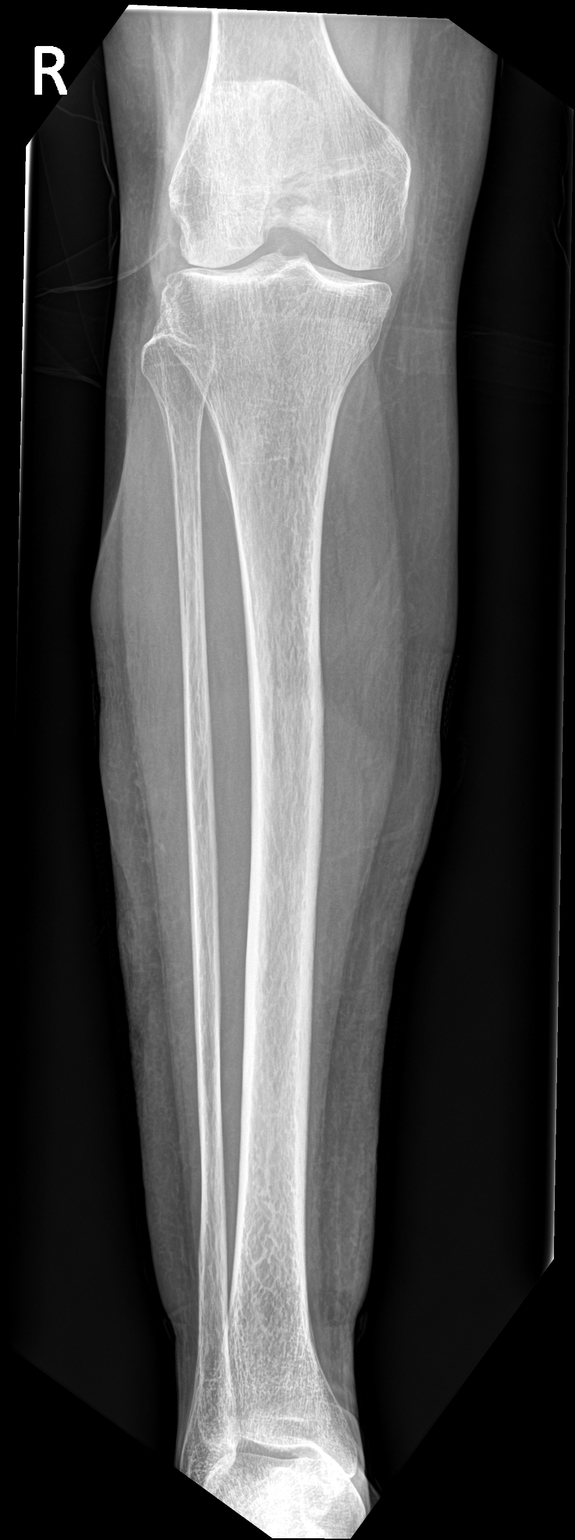

[2 of 2 positions shown; findings below may reference images not displayed]

FINDINGS: There is no evidence of fracture or other focal bone lesions. At
least mild degenerative arthritis is seen involving the lateral
compartment of the right knee. Mild diffuse subcutaneous edema
within the right lower extremity peripherally. Soft tissues are
otherwise unremarkable.
IMPRESSION: No acute fracture or dislocation

## 2022-02-12 ENCOUNTER — Ambulatory Visit (INDEPENDENT_AMBULATORY_CARE_PROVIDER_SITE_OTHER): Payer: Self-pay | Admitting: Surgical

## 2022-02-12 ENCOUNTER — Other Ambulatory Visit: Payer: Self-pay | Admitting: Surgical

## 2022-02-12 ENCOUNTER — Other Ambulatory Visit: Payer: Self-pay

## 2022-02-12 DIAGNOSIS — L905 Scar conditions and fibrosis of skin: Secondary | ICD-10-CM

## 2022-02-12 NOTE — Progress Notes (Signed)
Preoperative Dx: Hyperpigmented scar ? ?Postoperative Dx:  same ? ?Procedure: laser to right lower extremity ? ?Anesthesia: none ? ?Description of Procedure:  ?Risks and complications were explained to the patient. Consent was confirmed and signed. Time out was called and all information was confirmed to be correct. The area  area was prepped with alcohol and wiped dry. The 560 nm laser was set at 8 J/cm2. The right lower extremity was lasered with 70 passes over the affected site.  The 515 nm laser was set at 7 J/cm?, the right lower extremity was lasered with 70 passes the patient tolerated the procedure well and there were no complications.  ?

## 2022-03-25 ENCOUNTER — Ambulatory Visit (INDEPENDENT_AMBULATORY_CARE_PROVIDER_SITE_OTHER): Payer: Self-pay | Admitting: Surgical

## 2022-03-25 DIAGNOSIS — L905 Scar conditions and fibrosis of skin: Secondary | ICD-10-CM

## 2022-03-25 NOTE — Progress Notes (Signed)
Preoperative Dx: Hyperpigmentation of right lower extremity and left lower extremity scarring ? ?Postoperative Dx:  same ? ?Procedure: laser to bilateral lower extremities ? ?Anesthesia: none ? ?Description of Procedure:  ?Risks and complications were explained to the patient. Consent was confirmed and signed. Time out was called and all information was confirmed to be correct. The area  area was prepped with alcohol and wiped dry. The 560 nm laser was set at 7.5 J/cm? and the right lower extremity and left lower extremity was lasered with 70 passes over the hyperpigmented sites.  We attempted 8 J/cm?, however patient was unable to tolerate the intensity.  The 515 nm laser was then set at 7.5 J/cm? in the right lower extremity and left lower extremities were lasered.  The patient tolerated the procedure well. ? ?We discussed that I do not feel as if much more improvement can be obtained with the BBL laser, we will plan a follow-up early fall to reevaluate and discuss. ?

## 2023-10-12 DIAGNOSIS — M064 Inflammatory polyarthropathy: Secondary | ICD-10-CM | POA: Insufficient documentation

## 2024-03-19 ENCOUNTER — Encounter: Payer: Self-pay | Admitting: Family Medicine

## 2024-03-19 ENCOUNTER — Ambulatory Visit (INDEPENDENT_AMBULATORY_CARE_PROVIDER_SITE_OTHER): Payer: Self-pay | Admitting: Family Medicine

## 2024-03-19 ENCOUNTER — Other Ambulatory Visit: Payer: Self-pay

## 2024-03-19 ENCOUNTER — Other Ambulatory Visit (HOSPITAL_COMMUNITY): Payer: Self-pay

## 2024-03-19 VITALS — BP 106/76 | HR 56 | Ht 64.0 in | Wt 122.0 lb

## 2024-03-19 DIAGNOSIS — K219 Gastro-esophageal reflux disease without esophagitis: Secondary | ICD-10-CM | POA: Insufficient documentation

## 2024-03-19 DIAGNOSIS — M797 Fibromyalgia: Secondary | ICD-10-CM | POA: Insufficient documentation

## 2024-03-19 DIAGNOSIS — M25561 Pain in right knee: Secondary | ICD-10-CM

## 2024-03-19 DIAGNOSIS — M170 Bilateral primary osteoarthritis of knee: Secondary | ICD-10-CM

## 2024-03-19 DIAGNOSIS — M25562 Pain in left knee: Secondary | ICD-10-CM

## 2024-03-19 DIAGNOSIS — M1711 Unilateral primary osteoarthritis, right knee: Secondary | ICD-10-CM

## 2024-03-19 DIAGNOSIS — M545 Low back pain, unspecified: Secondary | ICD-10-CM | POA: Insufficient documentation

## 2024-03-19 DIAGNOSIS — M7580 Other shoulder lesions, unspecified shoulder: Secondary | ICD-10-CM | POA: Insufficient documentation

## 2024-03-19 DIAGNOSIS — G8929 Other chronic pain: Secondary | ICD-10-CM

## 2024-03-19 DIAGNOSIS — G629 Polyneuropathy, unspecified: Secondary | ICD-10-CM | POA: Insufficient documentation

## 2024-03-19 MED ORDER — TRIAMCINOLONE ACETONIDE 32 MG IX SRER: 32.0000 mg | Freq: Once | INTRA_ARTICULAR | 0 refills | Status: DC

## 2024-03-19 MED ORDER — TRIAMCINOLONE ACETONIDE 32 MG IX SRER
32.0000 mg | Freq: Once | INTRA_ARTICULAR | 0 refills | Status: AC
  Filled 2024-03-19 – 2024-03-21 (×2): qty 1, 1d supply, fill #0

## 2024-03-19 NOTE — Progress Notes (Unsigned)
 Joanna Muck, PhD, LAT, ATC acting as a scribe for Garlan Juniper, MD.  [name] is a 68 y.o. female who presents to [facility] at [facility] today for bilat knee pain that's chronic in nature. Pt was last seen at [facility] on 01/30/24 and was given bilat knee steroid injections. Pt claims these injections did not happen. She notes that the PRP injection that Dr. Donelda Fujita worsened his knee pain.   She is wanting a steroid injection in the L knee and a Zilretta injection in her R knee. She is having pain along the lateral aspect of her R knee. Also requesting a tramadol prescription as previously prescribed by Dr. Donelda Fujita.  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   Exam:  BP 106/76   Pulse (!) 56   Ht 5\' 4"  (1.626 m)   Wt 122 lb (55.3 kg)   SpO2 98%   BMI 20.94 kg/m  General: Well Developed, well nourished, and in no acute distress.   MSK: Right knee moderate effusion.  Decreased range of motion.  Intact strength.  Left knee mild effusion normal motion.  Intact strength.    Lab and Radiology Results  Procedure: Real-time Ultrasound Guided Injection of left knee joint superior lateral patella space Device: Philips Affiniti 50G/GE Logiq Images permanently stored and available for review in PACS Verbal informed consent obtained.  Discussed risks and benefits of procedure. Warned about infection, bleeding, hyperglycemia damage to structures among others. Patient expresses understanding and agreement Time-out conducted.   Noted no overlying erythema, induration, or other signs of local infection.   Skin prepped in a sterile fashion.   Local anesthesia: Topical Ethyl chloride.   With sterile technique and under real time  ultrasound guidance: 40 mg of Kenalog and 2 mL of Marcaine injected into knee joint. Fluid seen entering the joint capsule.   Completed without difficulty   Pain immediately resolved suggesting accurate placement of the medication.   Advised to call if fevers/chills, erythema, induration, drainage, or persistent bleeding.   Images permanently stored and available for review in the ultrasound unit.  Impression: Technically successful ultrasound guided injection.       Assessment and Plan: 68 y.o. 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  Will proceed with a conventional steroid injection in the left knee today.  Her right knee is worse and she has benefited from Zilretta injections in the past.  As she is a self-pay with Zilretta it will be cheaper to send it to a pharmacy and get it from there.  She will hear soon about scheduling right knee Zilretta injection.   PDMP not reviewed this encounter. Orders Placed This Encounter  Procedures   US  LIMITED JOINT SPACE STRUCTURES LOW BILAT(NO LINKED CHARGES)    Reason for Exam (SYMPTOM  OR DIAGNOSIS REQUIRED):   bilateral knee pain    Preferred imaging location?:   [facility]   Meds ordered this encounter  Medications   DISCONTD: Triamcinolone Acetonide (ZILRETTA) 32 MG SRER intra-articular injection    Sig: Inject 5 mLs (32 mg total) into the articular space once for 1 dose.    Dispense:  1 each    Refill:  0   Triamcinolone Acetonide (ZILRETTA) 32 MG SRER intra-articular injection    Sig: Inject 5 mLs (32 mg total)  into the articular space once for 1 dose.    Dispense:  1 each    Refill:  0     Discussed warning signs or symptoms. Please see discharge instructions. Patient expresses understanding.   The above documentation has been reviewed and is accurate and complete Garlan Juniper, M.D.

## 2024-03-19 NOTE — Patient Instructions (Signed)
 Thank you for coming in today.   You received an injection today. Seek immediate medical attention if the joint becomes red, extremely painful, or is oozing fluid.

## 2024-03-20 ENCOUNTER — Encounter (HOSPITAL_COMMUNITY): Payer: Self-pay

## 2024-03-20 ENCOUNTER — Encounter: Payer: Self-pay | Admitting: Family Medicine

## 2024-03-20 ENCOUNTER — Other Ambulatory Visit: Payer: Self-pay

## 2024-03-20 ENCOUNTER — Other Ambulatory Visit (HOSPITAL_COMMUNITY): Payer: Self-pay

## 2024-03-21 ENCOUNTER — Other Ambulatory Visit (HOSPITAL_COMMUNITY): Payer: Self-pay

## 2024-03-21 ENCOUNTER — Other Ambulatory Visit: Payer: Self-pay

## 2024-03-21 NOTE — Telephone Encounter (Signed)
 Pt called about this message, [facility] will NOT get this product for her and this was our error.  Please send RX to [facility] outpatient as originally discussed, pt wants asap.

## 2024-03-21 NOTE — Progress Notes (Signed)
 Specialty Pharmacy Initial Fill Coordination Note  [name] is a 68 y.o. female contacted today regarding initial fill of specialty medication(s) Triamcinolone Acetonide (ZILRETTA)   Patient requested Courier to Provider Office   Delivery date: 03/23/24   Verified address: [facility] at [address]   Medication will be filled on 5/8.   Patient is aware of $750 copayment.   Medication is non-formulary. She will pay out of pocket.

## 2024-03-21 NOTE — Telephone Encounter (Signed)
 Spoke to pt and explained, per her rx list, Zilretta was sent to the [facility]. I clarified that I personally entered this order and it was initially accidentally sent to her default pharmacy, [facility]. Once this error was realized, seconds later, the rx was then re-sent to the [facility]. I confirmed in her medication list that the order was corrected.   Pt was told that the pharmacy reached out to us , stating the Zilretta is a non-formulary medication and there would be no coverage. Pt said she is aware of the $700-$900 out of pocket cost. Pt was advised that if she is willing to pay this cost, she would need to contact the pharmacy and arrange payment. Pt verbalized understanding.   Pt was advised that if we are not able to get the Zilretta by Monday, to call back to cancel her appointment. She stated that, "she hoped not, because Dr. Alease Hunter wanted to do this shot as soon as possible."  Pt expressed apology for the treatment of our front office staff over the phone earlier. Message relayed via Teams.

## 2024-03-22 ENCOUNTER — Other Ambulatory Visit: Payer: Self-pay

## 2024-03-22 ENCOUNTER — Other Ambulatory Visit (HOSPITAL_COMMUNITY): Payer: Self-pay

## 2024-03-26 ENCOUNTER — Other Ambulatory Visit: Payer: Self-pay

## 2024-03-26 ENCOUNTER — Ambulatory Visit (INDEPENDENT_AMBULATORY_CARE_PROVIDER_SITE_OTHER): Payer: Self-pay | Admitting: Family Medicine

## 2024-03-26 VITALS — BP 106/76 | HR 56 | Ht 64.0 in

## 2024-03-26 DIAGNOSIS — M170 Bilateral primary osteoarthritis of knee: Secondary | ICD-10-CM

## 2024-03-26 DIAGNOSIS — M1711 Unilateral primary osteoarthritis, right knee: Secondary | ICD-10-CM

## 2024-03-26 MED ORDER — TRIAMCINOLONE ACETONIDE 32 MG IX SRER
32.0000 mg | Freq: Once | INTRA_ARTICULAR | Status: AC
  Administered 2024-03-26: 32 mg via INTRA_ARTICULAR

## 2024-03-26 NOTE — Patient Instructions (Signed)
 Thank you for coming in today.   You received an injection today. Seek immediate medical attention if the joint becomes red, extremely painful, or is oozing fluid.   Check back as needed

## 2024-03-26 NOTE — Progress Notes (Signed)
  Zilretta  injection right knee Procedure: Real-time Ultrasound Guided Injection of right knee joint superior lateral patellar space Device: Philips Affiniti 50G Images permanently stored and available for review in PACS Verbal informed consent obtained.  Discussed risks and benefits of procedure. Warned about infection, hyperglycemia bleeding, damage to structures among others. Patient expresses understanding and agreement Time-out conducted.   Noted no overlying erythema, induration, or other signs of local infection.   Skin prepped in a sterile fashion.   Local anesthesia: Topical Ethyl chloride.   With sterile technique and under real time ultrasound guidance: Zilretta  32 mg injected into knee joint. Fluid seen entering the joint capsule.   Completed without difficulty   Advised to call if fevers/chills, erythema, induration, drainage, or persistent bleeding.   Images permanently stored and available for review in the ultrasound unit.  Impression: Technically successful ultrasound guided injection.  Lot number: [id number] Patient purchased the Xarelto herself through her pharmacy benefit.  No medication charge to today's injection.

## 2024-05-07 ENCOUNTER — Other Ambulatory Visit: Payer: Self-pay
